# Patient Record
Sex: Female | Born: 1980 | Race: White | Hispanic: No | Marital: Married | Smoking: Never smoker
Health system: Southern US, Community
[De-identification: ages and names within clinical notes are randomized; demographics above are authoritative.]

## PROBLEM LIST (undated history)

## (undated) DIAGNOSIS — R079 Chest pain, unspecified: Secondary | ICD-10-CM

## (undated) DIAGNOSIS — Z8489 Family history of other specified conditions: Secondary | ICD-10-CM

## (undated) DIAGNOSIS — Z8249 Family history of ischemic heart disease and other diseases of the circulatory system: Secondary | ICD-10-CM

## (undated) DIAGNOSIS — M419 Scoliosis, unspecified: Secondary | ICD-10-CM

## (undated) HISTORY — DX: Family history of ischemic heart disease and other diseases of the circulatory system: Z82.49

## (undated) HISTORY — PX: OOPHORECTOMY: SHX86

## (undated) HISTORY — PX: OTHER SURGICAL HISTORY: SHX169

## (undated) HISTORY — PX: KNEE SURGERY: SHX244

## (undated) HISTORY — DX: Chest pain, unspecified: R07.9

## (undated) HISTORY — PX: FOOT SURGERY: SHX648

## (undated) HISTORY — PX: ABDOMINAL HYSTERECTOMY: SHX81

## (undated) HISTORY — PX: APPENDECTOMY: SHX54

## (undated) HISTORY — PX: BACK SURGERY: SHX140

---

## 1998-02-04 ENCOUNTER — Other Ambulatory Visit: Admission: RE | Admit: 1998-02-04 | Discharge: 1998-02-04 | Payer: Self-pay | Admitting: Obstetrics and Gynecology

## 1998-02-22 ENCOUNTER — Ambulatory Visit (HOSPITAL_COMMUNITY): Admission: RE | Admit: 1998-02-22 | Discharge: 1998-02-22 | Payer: Self-pay | Admitting: Obstetrics and Gynecology

## 1998-03-23 ENCOUNTER — Inpatient Hospital Stay: Admission: AD | Admit: 1998-03-23 | Discharge: 1998-03-23 | Payer: Self-pay | Admitting: Obstetrics and Gynecology

## 1998-07-08 ENCOUNTER — Inpatient Hospital Stay (HOSPITAL_COMMUNITY): Admission: AD | Admit: 1998-07-08 | Discharge: 1998-07-08 | Payer: Self-pay | Admitting: Obstetrics and Gynecology

## 1999-02-16 ENCOUNTER — Emergency Department (HOSPITAL_COMMUNITY): Admission: EM | Admit: 1999-02-16 | Discharge: 1999-02-16 | Payer: Self-pay | Admitting: Emergency Medicine

## 2002-06-25 ENCOUNTER — Emergency Department (HOSPITAL_COMMUNITY): Admission: EM | Admit: 2002-06-25 | Discharge: 2002-06-25 | Payer: Self-pay | Admitting: Emergency Medicine

## 2002-12-17 ENCOUNTER — Encounter: Payer: Self-pay | Admitting: Emergency Medicine

## 2002-12-17 ENCOUNTER — Emergency Department (HOSPITAL_COMMUNITY): Admission: EM | Admit: 2002-12-17 | Discharge: 2002-12-17 | Payer: Self-pay | Admitting: Emergency Medicine

## 2004-02-14 ENCOUNTER — Ambulatory Visit: Payer: Self-pay | Admitting: Urology

## 2004-02-29 ENCOUNTER — Other Ambulatory Visit: Admission: RE | Admit: 2004-02-29 | Discharge: 2004-02-29 | Payer: Self-pay | Admitting: Family Medicine

## 2004-02-29 ENCOUNTER — Ambulatory Visit (HOSPITAL_COMMUNITY): Admission: RE | Admit: 2004-02-29 | Discharge: 2004-02-29 | Payer: Self-pay | Admitting: Family Medicine

## 2004-09-20 ENCOUNTER — Emergency Department (HOSPITAL_COMMUNITY): Admission: EM | Admit: 2004-09-20 | Discharge: 2004-09-20 | Payer: Self-pay | Admitting: Internal Medicine

## 2004-12-20 ENCOUNTER — Ambulatory Visit (HOSPITAL_COMMUNITY): Admission: RE | Admit: 2004-12-20 | Discharge: 2004-12-20 | Payer: Self-pay | Admitting: Family Medicine

## 2004-12-20 ENCOUNTER — Emergency Department (HOSPITAL_COMMUNITY): Admission: EM | Admit: 2004-12-20 | Discharge: 2004-12-20 | Payer: Self-pay | Admitting: Family Medicine

## 2005-09-24 ENCOUNTER — Emergency Department (HOSPITAL_COMMUNITY): Admission: EM | Admit: 2005-09-24 | Discharge: 2005-09-25 | Payer: Self-pay | Admitting: Emergency Medicine

## 2006-01-09 ENCOUNTER — Emergency Department (HOSPITAL_COMMUNITY): Admission: EM | Admit: 2006-01-09 | Discharge: 2006-01-09 | Payer: Self-pay | Admitting: Emergency Medicine

## 2007-02-10 ENCOUNTER — Ambulatory Visit: Payer: Self-pay | Admitting: Family Medicine

## 2009-07-18 ENCOUNTER — Emergency Department (HOSPITAL_COMMUNITY): Admission: EM | Admit: 2009-07-18 | Discharge: 2009-07-18 | Payer: Self-pay | Admitting: Emergency Medicine

## 2010-12-03 ENCOUNTER — Ambulatory Visit: Payer: Self-pay | Admitting: Obstetrics & Gynecology

## 2010-12-16 ENCOUNTER — Ambulatory Visit: Payer: Self-pay | Admitting: Obstetrics & Gynecology

## 2010-12-17 LAB — PATHOLOGY REPORT

## 2011-05-13 ENCOUNTER — Emergency Department (HOSPITAL_COMMUNITY)
Admission: EM | Admit: 2011-05-13 | Discharge: 2011-05-13 | Disposition: A | Discharge status: Home / Self Care | Payer: 59 | Attending: Emergency Medicine | Admitting: Emergency Medicine

## 2011-05-13 ENCOUNTER — Encounter (HOSPITAL_COMMUNITY): Payer: Self-pay

## 2011-05-13 DIAGNOSIS — T5894XA Toxic effect of carbon monoxide from unspecified source, undetermined, initial encounter: Secondary | ICD-10-CM | POA: Insufficient documentation

## 2011-05-13 DIAGNOSIS — T588X1A Toxic effect of carbon monoxide from other source, accidental (unintentional), initial encounter: Secondary | ICD-10-CM | POA: Insufficient documentation

## 2011-05-13 DIAGNOSIS — R51 Headache: Secondary | ICD-10-CM

## 2011-05-13 MED ORDER — OXYCODONE-ACETAMINOPHEN 5-325 MG PO TABS
1.0000 | ORAL_TABLET | Freq: Once | ORAL | Status: AC
  Administered 2011-05-13: 1 via ORAL
  Filled 2011-05-13: qty 1

## 2011-05-13 NOTE — ED Notes (Signed)
Pt presents with 1 week h/o headache.  Pt reports home heater went out last night and found out today that there was a carbon monoxide leak.  Son has had symptoms x 2 weeks.  Pt reports dizziness x 2-3 days with today worse and blurred vision.

## 2011-05-13 NOTE — ED Provider Notes (Signed)
History     CSN: [id number]  Arrival date & time 05/13/11  1538   None     Chief Complaint  Patient presents with  . Headache    (Consider location/radiation/quality/duration/timing/severity/associated sxs/prior treatment) Patient is a 31 y.o. female presenting with headaches. The history is provided by the patient.  Headache  Pertinent negatives include no fever and no shortness of breath.   the patient is a 31 year old, female, with no significant past medical history.  She and her son have been having headaches.  For approximately 2 weeks.  Today.  Her husband had the gas company come out.  They detected a carbon monoxide leak.  She has a frontal headache.  She denies nausea, vomiting.  She denies chest pain.  She has not passed.  Out.  She denies any pain anywhere else.  She has not been back in her home since 7:30 this morning.  Past Medical History  Diagnosis Date  . Endometriosis     Past Surgical History  Procedure Date  . Abdominal hysterectomy   . Appendectomy     No family history on file.  History  Substance Use Topics  . Smoking status: Never Smoker   . Smokeless tobacco: Not on file  . Alcohol Use: Yes    OB History    Grav Para Term Preterm Abortions TAB SAB Ect Mult Living                  Review of Systems  Constitutional: Negative for fever.  HENT: Negative for neck pain.   Eyes: Negative for photophobia.  Respiratory: Negative for shortness of breath.   Cardiovascular: Negative for chest pain.  Neurological: Positive for headaches. Negative for seizures and syncope.  Psychiatric/Behavioral: Negative for confusion.  All other systems reviewed and are negative.    Allergies  Review of patient's allergies indicates no known allergies.  Home Medications   Current Outpatient Rx  Name Route Sig Dispense Refill  . PHENTERMINE HCL 37.5 MG PO CAPS Oral Take 37.5 mg by mouth every morning.    Marland Kitchen VITAMIN B-12 100 MCG PO TABS Oral Take 50 mcg by  mouth daily.      BP 121/88  Pulse 98  Temp(Src) 98 F (36.7 C) (Oral)  Resp 16  SpO2 99%  Physical Exam  Constitutional: She is oriented to person, place, and time. She appears well-developed and well-nourished.  HENT:  Head: Normocephalic and atraumatic.  Eyes: Conjunctivae are normal. Pupils are equal, round, and reactive to light.  Neck: Normal range of motion. Neck supple.  Cardiovascular: Normal rate.   No murmur heard. Pulmonary/Chest: Effort normal and breath sounds normal.  Musculoskeletal: Normal range of motion.  Neurological: She is alert and oriented to person, place, and time. No cranial nerve deficit.  Skin: Skin is warm and dry.  Psychiatric: She has a normal mood and affect. Her behavior is normal. Judgment and thought content normal.    ED Course  Procedures (including critical care time) Frontal headache with exposure to carbon monoxide.  No alteration in mental status.  No history of chest pain, shortness breath, or syncope, or seizures.  The patient has not been in her home since 7:30 this morning.  There is no indication for oxygen therapy or measurement of a carbon monoxide level at this time.  Labs Reviewed - No data to display No results found.   No diagnosis found.    MDM  Headache        Tinnie Gens  Brock Bad, MD 05/13/11 1737

## 2012-01-07 ENCOUNTER — Ambulatory Visit: Payer: Self-pay | Admitting: Podiatry

## 2012-07-27 DIAGNOSIS — M4120 Other idiopathic scoliosis, site unspecified: Secondary | ICD-10-CM | POA: Insufficient documentation

## 2013-01-29 ENCOUNTER — Emergency Department (HOSPITAL_COMMUNITY)
Admission: EM | Admit: 2013-01-29 | Discharge: 2013-01-29 | Disposition: A | Discharge status: Home / Self Care | Payer: 59 | Source: Home / Self Care

## 2013-01-29 ENCOUNTER — Encounter (HOSPITAL_COMMUNITY): Payer: Self-pay | Admitting: *Deleted

## 2013-01-29 ENCOUNTER — Emergency Department (INDEPENDENT_AMBULATORY_CARE_PROVIDER_SITE_OTHER): Payer: 59

## 2013-01-29 DIAGNOSIS — M940 Chondrocostal junction syndrome [Tietze]: Secondary | ICD-10-CM

## 2013-01-29 DIAGNOSIS — R141 Gas pain: Secondary | ICD-10-CM

## 2013-01-29 DIAGNOSIS — K5900 Constipation, unspecified: Secondary | ICD-10-CM

## 2013-01-29 HISTORY — DX: Scoliosis, unspecified: M41.9

## 2013-01-29 LAB — POCT URINALYSIS DIP (DEVICE)
Bilirubin Urine: NEGATIVE
Glucose, UA: NEGATIVE mg/dL
Ketones, ur: NEGATIVE mg/dL
Nitrite: NEGATIVE
Protein, ur: NEGATIVE mg/dL
Specific Gravity, Urine: 1.02 (ref 1.005–1.030)
Urobilinogen, UA: 0.2 mg/dL (ref 0.0–1.0)
pH: 6 (ref 5.0–8.0)

## 2013-01-29 NOTE — ED Provider Notes (Signed)
CSN: [id number]     Arrival date & time 01/29/13  1807 History   First MD Initiated Contact with Patient 01/29/13 1851     Chief Complaint  Patient presents with  . Abdominal Pain   (Consider location/radiation/quality/duration/timing/severity/associated sxs/prior Treatment) HPI Comments: Obese 32 year old female presents with epigastric and left upper quadrant pain that started 3 days ago. It started approximately 30 minutes after eating a meal. She and her husband both only herbal life diet. The pain is worse when taking a deep breath. Denies nausea, vomiting, diarrhea, constipation, fever, chills, sore throat, cough or respiratory symptoms.   Past Medical History  Diagnosis Date  . Endometriosis   . Scoliosis    Past Surgical History  Procedure Laterality Date  . Abdominal hysterectomy    . Appendectomy    . Oophorectomy    . Back surgery      Harrington Rod placement  . Foot surgery    . Endometrial surgery    . Cesarean section      x2   No family history on file. History  Substance Use Topics  . Smoking status: Never Smoker   . Smokeless tobacco: Not on file  . Alcohol Use: No   OB History   Grav Para Term Preterm Abortions TAB SAB Ect Mult Living                 Review of Systems  Constitutional: Negative.   HENT: Negative.   Cardiovascular: Negative.   Gastrointestinal: Positive for abdominal pain. Negative for nausea, vomiting, diarrhea, constipation, blood in stool, abdominal distention, anal bleeding and rectal pain.  Genitourinary: Negative.   Musculoskeletal: Negative.   Skin: Negative.   Neurological: Negative.     Allergies  Review of patient's allergies indicates no known allergies.  Home Medications   Current Outpatient Rx  Name  Route  Sig  Dispense  Refill  . UNABLE TO FIND      Med Name: "Cell Activator through [employer]"         . phentermine 37.5 MG capsule   Oral   Take 37.5 mg by mouth every morning.         . vitamin  B-12 (CYANOCOBALAMIN) 100 MCG tablet   Oral   Take 50 mcg by mouth daily.          BP 126/81  Pulse 82  Temp(Src) 98 F (36.7 C) (Oral)  Resp 18  SpO2 100% Physical Exam  Nursing note and vitals reviewed. Constitutional: She is oriented to person, place, and time. She appears well-developed and well-nourished. No distress.  Eyes: Conjunctivae and EOM are normal.  Neck: Normal range of motion. Neck supple.  Cardiovascular: Normal rate, regular rhythm and normal heart sounds.   Pulmonary/Chest: Effort normal and breath sounds normal. No respiratory distress. She has no wheezes.  There is unequivocal reproducible tenderness to the left costal margin from the xiphoid to the midaxillary line of the rib cage. This is in addition to the left upper quadrant abdominal pain.  Abdominal: Soft. Bowel sounds are normal. She exhibits no distension and no mass. There is tenderness. There is no rebound and no guarding.  This abdominal tenderness in the epigastrium left upper and mid quadrant. No lower abdominal or pelvic tenderness. No tenderness or pain to the right hemiabdomen. No palpable masses.  Musculoskeletal: She exhibits no edema and no tenderness.  Neurological: She is alert and oriented to person, place, and time. She exhibits normal muscle tone.  Skin: Skin  is warm. No rash noted.  Psychiatric: She has a normal mood and affect.    ED Course  Procedures (including critical care time) Labs Review Labs Reviewed  POCT URINALYSIS DIP (DEVICE) - Abnormal; Notable for the following:    Hgb urine dipstick TRACE (*)    Leukocytes, UA TRACE (*)    All other components within normal limits   Imaging Review Dg Abd 1 View  01/29/2013   CLINICAL DATA:  Abdominal pain  EXAM: ABDOMEN - 1 VIEW  COMPARISON:  None.  FINDINGS: Scattered large and small bowel gas is noted. Postsurgical changes are noted in thoracolumbar spine. No mass or abnormal calcifications are noted.  IMPRESSION: No acute  abnormality seen.   Electronically Signed   By: Alcide Clever M.D.   On: 01/29/2013 19:38    MDM   1. Abdominal gas pain   2. Costochondritis   3. Constipation      X-ray shows substantial amount of gas in the large and small colon. No air fluid levels or evidence of obstruction. She also has chest wall pain/costochondritis. She is given instructions on constipation, using MiraLax, increasing fluids, prune juice and other measures for constipation. Apply ice to the chest to help with comfort. If needed may take ibuprofen with food for the rib pain. For worsening abdominal pain, bleeding or dark tarry stools, increased abdominal pain inability to have a bowel movement, vomiting either go to the emergent apartment her follow up with your PCP or may return.   Hayden Rasmussen, NP 01/29/13 1958

## 2013-01-29 NOTE — ED Provider Notes (Signed)
Medical screening examination/treatment/procedure(s) were performed by non-physician practitioner and as supervising physician I was immediately available for consultation/collaboration.  Leslee Home, M.D.  Reuben Likes, MD 01/29/13 Rosamaria Lints

## 2013-01-29 NOTE — ED Notes (Signed)
C/O intermittent LUQ abd pain x 2-3 days.  When pain occurs, it happens after eating (though not every time she eats).  Pain worse with deep breathing, sitting, or standing.  Pain now wrapping around left side.  Denies fevers.  Denies n/v.  Has tried Tyl, IBU, and 2 Tramadol without any change.

## 2013-07-14 ENCOUNTER — Emergency Department: Payer: Self-pay | Admitting: Emergency Medicine

## 2013-07-14 LAB — CBC
HCT: 38.1 % (ref 35.0–47.0)
HGB: 12.5 g/dL (ref 12.0–16.0)
MCH: 26.6 pg (ref 26.0–34.0)
MCHC: 32.9 g/dL (ref 32.0–36.0)
MCV: 81 fL (ref 80–100)
Platelet: 294 10*3/uL (ref 150–440)
RBC: 4.71 10*6/uL (ref 3.80–5.20)
RDW: 16.3 % — ABNORMAL HIGH (ref 11.5–14.5)
WBC: 9.5 10*3/uL (ref 3.6–11.0)

## 2013-07-14 LAB — BASIC METABOLIC PANEL
Anion Gap: 3 — ABNORMAL LOW (ref 7–16)
BUN: 10 mg/dL (ref 7–18)
Calcium, Total: 9 mg/dL (ref 8.5–10.1)
Chloride: 105 mmol/L (ref 98–107)
Co2: 29 mmol/L (ref 21–32)
Creatinine: 0.91 mg/dL (ref 0.60–1.30)
EGFR (African American): >60
EGFR (Non-African Amer.): >60
Glucose: 103 mg/dL — ABNORMAL HIGH (ref 65–99)
Osmolality: 273 (ref 275–301)
Potassium: 3.7 mmol/L (ref 3.5–5.1)
Sodium: 137 mmol/L (ref 136–145)

## 2013-07-14 LAB — TROPONIN I: Troponin-I: <0.02 ng/mL

## 2013-08-11 ENCOUNTER — Encounter (HOSPITAL_COMMUNITY): Payer: Self-pay | Admitting: Emergency Medicine

## 2013-08-11 ENCOUNTER — Emergency Department (HOSPITAL_COMMUNITY)
Admission: EM | Admit: 2013-08-11 | Discharge: 2013-08-11 | Disposition: A | Discharge status: Home / Self Care | Payer: 59 | Source: Home / Self Care | Attending: Family Medicine | Admitting: Family Medicine

## 2013-08-11 DIAGNOSIS — H8110 Benign paroxysmal vertigo, unspecified ear: Secondary | ICD-10-CM

## 2013-08-11 MED ORDER — MECLIZINE HCL 25 MG PO TABS: 25.0000 mg | ORAL_TABLET | Freq: Three times a day (TID) | ORAL | Status: DC | PRN

## 2013-08-11 MED ORDER — ONDANSETRON HCL 4 MG PO TABS: 4.0000 mg | ORAL_TABLET | Freq: Three times a day (TID) | ORAL | Status: DC | PRN

## 2013-08-11 MED ORDER — ONDANSETRON 4 MG PO TBDP
4.0000 mg | ORAL_TABLET | Freq: Once | ORAL | Status: AC
  Administered 2013-08-11: 4 mg via ORAL

## 2013-08-11 MED ORDER — ONDANSETRON 4 MG PO TBDP
ORAL_TABLET | ORAL | Status: AC
  Filled 2013-08-11: qty 1

## 2013-08-11 NOTE — ED Provider Notes (Signed)
CSN: [id number]     Arrival date & time 08/11/13  1014 History   None    Chief Complaint  Patient presents with  . Dizziness   (Consider location/radiation/quality/duration/timing/severity/associated sxs/prior Treatment) Patient is a 33 y.o. female presenting with dizziness. The history is provided by the patient.  Dizziness Quality:  Head spinning Severity:  Moderate Onset quality:  Gradual Duration:  4 days Timing:  Intermittent Progression:  Waxing and waning Chronicity:  New Context: bending over, eye movement and head movement   Context: not with bowel movement, not with ear pain, not with loss of consciousness, not with medication, not with physical activity, not when standing up and not when urinating   Relieved by:  Being still Worsened by:  Movement and turning head Associated symptoms: headaches, nausea and vomiting   Associated symptoms: no blood in stool, no chest pain, no diarrhea, no hearing loss, no palpitations, no shortness of breath, no syncope, no tinnitus, no vision changes and no weakness   Risk factors: no anemia, no hx of stroke, no hx of vertigo, no Meniere's disease and no new medications     Past Medical History  Diagnosis Date  . Endometriosis   . Scoliosis    Past Surgical History  Procedure Laterality Date  . Abdominal hysterectomy    . Appendectomy    . Oophorectomy    . Back surgery      Harrington Rod placement  . Foot surgery    . Endometrial surgery    . Cesarean section      x2   History reviewed. No pertinent family history. History  Substance Use Topics  . Smoking status: Never Smoker   . Smokeless tobacco: Not on file  . Alcohol Use: No   OB History   Grav Para Term Preterm Abortions TAB SAB Ect Mult Living                 Review of Systems  Constitutional: Negative.   HENT: Negative for congestion, ear pain, hearing loss, nosebleeds, rhinorrhea, sinus pressure, sore throat and tinnitus.   Eyes: Negative.   Respiratory:  Negative for cough, chest tightness and shortness of breath.   Cardiovascular: Negative for chest pain, palpitations, leg swelling and syncope.  Gastrointestinal: Positive for nausea and vomiting. Negative for abdominal pain, diarrhea, constipation, blood in stool, abdominal distention and anal bleeding.  Endocrine: Negative for polydipsia, polyphagia and polyuria.  Genitourinary: Negative.   Musculoskeletal: Negative.   Skin: Negative.   Neurological: Positive for dizziness and headaches. Negative for seizures, syncope, weakness, light-headedness and numbness.  Psychiatric/Behavioral: Negative for confusion.    Allergies  Review of patient's allergies indicates no known allergies.  Home Medications   Prior to Admission medications   Medication Sig Start Date End Date Taking? Authorizing Provider  phentermine 37.5 MG capsule Take 37.5 mg by mouth every morning.    Historical Provider, MD  UNABLE TO FIND Med Name: "Cell Activator through [employer]"    Historical Provider, MD  vitamin B-12 (CYANOCOBALAMIN) 100 MCG tablet Take 50 mcg by mouth daily.    Historical Provider, MD   BP 142/90  Pulse 78  Temp(Src) 98.3 F (36.8 C) (Oral)  SpO2 100% Physical Exam  Nursing note and vitals reviewed. Constitutional: She is oriented to person, place, and time. She appears well-developed and well-nourished. No distress.  HENT:  Head: Normocephalic and atraumatic.  Right Ear: Hearing, tympanic membrane, external ear and ear canal normal.  Left Ear: Hearing, tympanic membrane, external  ear and ear canal normal.  Nose: Nose normal.  Mouth/Throat: Uvula is midline, oropharynx is clear and moist and mucous membranes are normal.  Eyes: Conjunctivae, EOM and lids are normal. Pupils are equal, round, and reactive to light. Right eye exhibits no nystagmus. Left eye exhibits no nystagmus.  Neck: Normal range of motion. Neck supple. No thyromegaly present.  Cardiovascular: Normal rate, regular rhythm  and normal heart sounds.   Pulmonary/Chest: Effort normal and breath sounds normal.  Abdominal: Soft. Bowel sounds are normal. She exhibits no distension. There is no tenderness.  Musculoskeletal: Normal range of motion.  Lymphadenopathy:    She has no cervical adenopathy.  Neurological: She is alert and oriented to person, place, and time. She has normal strength. No cranial nerve deficit or sensory deficit. She displays a negative Romberg sign. Coordination and gait normal. GCS eye subscore is 4. GCS verbal subscore is 5. GCS motor subscore is 6.  Dizziness and sensation of room spinning reproduced in exam room with turning of head from side to side and also when patient was asked to look downward or flex head forward  Skin: Skin is warm and dry.  Psychiatric: She has a normal mood and affect. Her behavior is normal.    ED Course  Procedures (including critical care time) Labs Review Labs Reviewed - No data to display  Results for orders placed during the hospital encounter of 01/29/13  POCT URINALYSIS DIP (DEVICE)      Result Value Ref Range   Glucose, UA NEGATIVE  NEGATIVE mg/dL   Bilirubin Urine NEGATIVE  NEGATIVE   Ketones, ur NEGATIVE  NEGATIVE mg/dL   Specific Gravity, Urine 1.020  1.005 - 1.030   Hgb urine dipstick TRACE (*) NEGATIVE   pH 6.0  5.0 - 8.0   Protein, ur NEGATIVE  NEGATIVE mg/dL   Urobilinogen, UA 0.2  0.0 - 1.0 mg/dL   Nitrite NEGATIVE  NEGATIVE   Leukocytes, UA TRACE (*) NEGATIVE   Imaging Review No results found.   MDM   1. Benign positional vertigo   Exam without focal neurological deficit. Will provide patient with Rx for meclizine and zofran for symptom relief. She was advised not to drive until symptoms resolve. She was instructed to follow up with PCP if symptoms persist over next 4-5 days despite using medications. If symptoms become suddenly worse or severe and persistent, she is to seek evaluation at her nearest ER.     [place],  [place] 08/11/13 1140

## 2013-08-11 NOTE — ED Provider Notes (Signed)
Medical screening examination/treatment/procedure(s) were performed by resident physician or non-physician practitioner and as supervising physician I was immediately available for consultation/collaboration.   Pauline Good MD.   Billy Fischer, MD 08/11/13 1225

## 2013-08-11 NOTE — Discharge Instructions (Signed)

## 2013-08-11 NOTE — ED Notes (Signed)
Stats she has ben having dizzy spells since Tuesday, w nausea and vomiting. Just off antibiotics for URI. , seen in ED for chest pain and was told her EKG didn't read right, but her blood levels were okay. Reportedly has an appointment to see cardiologist next week. NAD

## 2013-08-14 ENCOUNTER — Encounter: Payer: Self-pay | Admitting: Cardiovascular Disease

## 2013-08-14 ENCOUNTER — Ambulatory Visit (INDEPENDENT_AMBULATORY_CARE_PROVIDER_SITE_OTHER): Payer: 59 | Admitting: Cardiovascular Disease

## 2013-08-14 VITALS — BP 112/92 | HR 98 | Ht 65.0 in | Wt 194.1 lb

## 2013-08-14 DIAGNOSIS — Z8349 Family history of other endocrine, nutritional and metabolic diseases: Secondary | ICD-10-CM

## 2013-08-14 DIAGNOSIS — Z83438 Family history of other disorder of lipoprotein metabolism and other lipidemia: Secondary | ICD-10-CM

## 2013-08-14 DIAGNOSIS — R079 Chest pain, unspecified: Secondary | ICD-10-CM

## 2013-08-14 NOTE — Progress Notes (Signed)
     08/14/2013 Crystal Pearson   [birth year]  [id number]  Primary Physician Crystal Naas, MD Primary Cardiologist: Crystal Harp MD Crystal Pearson   HPI:  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 She self referred for cardiovascular evaluation because of a recent episode of prolonged chest pain.she apparently developed chest pain at a.m. Several weeks ago. This lasted up until 10 AM. She was seen at [facility] emergency room. Troponins apparently negative and she was released home. She has had no recurrent chest pain. Her cardiovascular risk factor profile is only remarkable for family history with a father who had bypass surgery in his 55s but is otherwise negative. She has had a partial hysterectomy in the past.   No current outpatient prescriptions on file.   No current facility-administered medications for this visit.    No Known Allergies  History   Social History  . Marital Status: Married    Spouse Name: N/A    Number of Children: N/A  . Years of Education: N/A   Occupational History  . Not on file.   Social History Main Topics  . Smoking status: Never Smoker   . Smokeless tobacco: Not on file  . Alcohol Use: No  . Drug Use: No  . Sexual Activity: Not on file   Other Topics Concern  . Not on file   Social History Narrative  . No narrative on file     Review of Systems: General: negative for chills, fever, night sweats or weight changes.  Cardiovascular: negative for chest pain, dyspnea on exertion, edema, orthopnea, palpitations, paroxysmal nocturnal dyspnea or shortness of breath Dermatological: negative for rash Respiratory: negative for cough or wheezing Urologic: negative for hematuria Abdominal: negative for nausea, vomiting, diarrhea, bright red blood per rectum, melena, or hematemesis Neurologic: negative for visual  changes, syncope, or dizziness All other systems reviewed and are otherwise negative except as noted above.    Blood pressure 112/92, pulse 98, height 5\' 5"  (1.651 m), weight 194 lb 1.6 oz (88.043 kg).  General appearance: alert and no distress Neck: no adenopathy, no carotid bruit, no JVD, supple, symmetrical, trachea midline and thyroid not enlarged, symmetric, no tenderness/mass/nodules Lungs: clear to auscultation bilaterally Heart: regular rate and rhythm, S1, S2 normal, no murmur, click, rub or gallop Extremities: extremities normal, atraumatic, no cyanosis or edema  EKG normal sinus rhythm at 98 with nonspecific ST and T wave changes  ASSESSMENT AND PLAN:   Chest pain The patient developed substernal chest pain at 3 AM several weeks ago. The pain lasted until 10 AM. She was seen at [facility] emergency room where she was evaluated. Troponins were negative and she was sent home. She did see a cardiologist back in followup who apparently reassured her. She's had no recurrent symptoms. Risk factors include family history of heart disease with a father who had coronary bypass grafting in his 84s. I am going to get a NMR Lipid Profile risk stratify her for the echo to rule out regional wall motion abnormalities given her prolonged episode of chest pain.      Crystal Harp MD FACP,FACC,FAHA, [facility] 08/14/2013 3:38 PM

## 2013-08-14 NOTE — Patient Instructions (Signed)
  We will see you back in follow up in 1 year with Dr Gwenlyn Found  Dr Gwenlyn Found has ordered an echocardiogram and blood work to be done.

## 2013-08-14 NOTE — Assessment & Plan Note (Addendum)
The patient developed substernal chest pain at 3 AM several weeks ago. The pain lasted until 10 AM. She was seen at [facility] emergency room where she was evaluated. Troponins were negative and she was sent home. She did see a cardiologist back in followup who apparently reassured her. She's had no recurrent symptoms. Risk factors include family history of heart disease with a father who had coronary bypass grafting in his 54s. I am going to get a NMR Lipid Profile risk stratify her for the echo to rule out regional wall motion abnormalities given her prolonged episode of chest pain.

## 2013-08-21 ENCOUNTER — Ambulatory Visit (HOSPITAL_COMMUNITY): Payer: 59

## 2013-09-01 ENCOUNTER — Telehealth (HOSPITAL_COMMUNITY): Payer: Self-pay | Admitting: *Deleted

## 2013-09-12 ENCOUNTER — Telehealth (HOSPITAL_COMMUNITY): Payer: Self-pay | Admitting: *Deleted

## 2014-03-27 DIAGNOSIS — M47816 Spondylosis without myelopathy or radiculopathy, lumbar region: Secondary | ICD-10-CM | POA: Insufficient documentation

## 2016-08-28 ENCOUNTER — Encounter (HOSPITAL_COMMUNITY): Payer: Self-pay

## 2016-08-28 ENCOUNTER — Ambulatory Visit (HOSPITAL_COMMUNITY)
Admission: EM | Admit: 2016-08-28 | Discharge: 2016-08-28 | Disposition: A | Discharge status: Nursing Home (Includes ICF) | Payer: Self-pay

## 2016-08-28 ENCOUNTER — Emergency Department (HOSPITAL_COMMUNITY)
Admission: EM | Admit: 2016-08-28 | Discharge: 2016-08-29 | Disposition: A | Discharge status: Home / Self Care | Payer: BC Managed Care – PPO | Attending: Emergency Medicine | Admitting: Emergency Medicine

## 2016-08-28 DIAGNOSIS — G9009 Other idiopathic peripheral autonomic neuropathy: Secondary | ICD-10-CM | POA: Insufficient documentation

## 2016-08-28 DIAGNOSIS — G608 Other hereditary and idiopathic neuropathies: Secondary | ICD-10-CM

## 2016-08-28 DIAGNOSIS — G629 Polyneuropathy, unspecified: Secondary | ICD-10-CM

## 2016-08-28 DIAGNOSIS — M79661 Pain in right lower leg: Secondary | ICD-10-CM | POA: Diagnosis present

## 2016-08-28 NOTE — ED Notes (Addendum)
Pt presents with numbness and tingling to R shin with veins more visible than normal.  Pt sts she went to urgent care, who said they did not have the means to treat her if it was a blood clot.  Urgent care sent her here.  Family hx of blood clots.

## 2016-08-28 NOTE — ED Provider Notes (Signed)
[facility] DEPT Provider Note   CSN: [id number] Arrival date & time: 08/28/16  1922   By signing my name below, I, Crystal Pearson, attest that this documentation has been prepared under the direction and in the presence of  [name], PA-C . Electronically Signed: Delton Pearson, ED Scribe. 08/28/16. 11:38 PM.   History   Chief Complaint Chief Complaint  Patient presents with  . Leg Pain    HPI Comments:  Crystal Pearson is a 36 y.o. female who presents to the Emergency Department complaining of acute onset, persistent right shin numbness, pain and minimal swelling beginning 4 days ago. Pt states she has been experiencing right sided lower back pain intermittently but notes she has not felt this pain is several days. Pt reports her mother had a DVT during a pregnacy. No h/o of hypercoagulable conditions. No alleviating factors noted. Pt denies SOB or any other associated symptoms. Pt also denies recent long distance travel, any recent surgeries, any recent injuries/falls, medication allergies or a hx of similar symptoms. Pt notes she takes Prozac on a daily basis for anxiety/depression. No estrogen-containing birth control. No other complaints noted at this time.   The history is provided by the patient. No language interpreter was used.    Past Medical History:  Diagnosis Date  . Chest pain   . Endometriosis   . Family history of heart disease   . Scoliosis     Patient Active Problem List   Diagnosis Date Noted  . Chest pain 08/14/2013    Past Surgical History:  Procedure Laterality Date  . ABDOMINAL HYSTERECTOMY    . APPENDECTOMY    . BACK SURGERY     Harrington Rod placement  . CESAREAN SECTION     x2  . endometrial surgery    . FOOT SURGERY    . OOPHORECTOMY      OB History    No data available       Home Medications    Prior to Admission medications   Not on File    Family History Family History  Problem Relation Age of Onset  . Hypertension Mother    . Cancer Father   . Heart disease Father     Social History Social History  Substance Use Topics  . Smoking status: Never Smoker  . Smokeless tobacco: Never Used  . Alcohol use No     Allergies   Patient has no known allergies.   Review of Systems Review of Systems  Constitutional: Negative for activity change.  Respiratory: Negative for shortness of breath.   Cardiovascular: Negative for chest pain.  Gastrointestinal: Negative for abdominal pain.  Musculoskeletal: Positive for myalgias. Negative for arthralgias, back pain and gait problem.  Skin: Negative for rash.  Allergic/Immunologic: Negative for immunocompromised state.  Neurological: Positive for numbness. Negative for dizziness, tremors, syncope, weakness, light-headedness and headaches.  Hematological: Does not bruise/bleed easily.  Psychiatric/Behavioral: Negative for confusion.    Physical Exam Updated Vital Signs BP 136/83 (BP Location: Left Arm)   Pulse (!) 55   Temp 98.2 F (36.8 C)   Resp 18   SpO2 100%   Physical Exam  Constitutional: She is oriented to person, place, and time. She appears well-developed and well-nourished. No distress.  HENT:  Head: Normocephalic and atraumatic.  Eyes: Conjunctivae are normal.  Neck: Neck supple.  Cardiovascular: Normal rate and regular rhythm.  Exam reveals no gallop and no friction rub.   No murmur heard. Pulmonary/Chest: Effort normal and breath  sounds normal. No respiratory distress. She has no wheezes. She has no rales.  Abdominal: Soft. She exhibits no distension. There is no tenderness. There is no guarding.  Musculoskeletal: She exhibits no edema.  Neurological: She is alert and oriented to person, place, and time. She has normal strength. She displays no atrophy. No cranial nerve deficit. She displays a negative Romberg sign. Coordination and gait normal. GCS eye subscore is 4. GCS verbal subscore is 5. GCS motor subscore is 6.  Reflex Scores:       Patellar reflexes are 4+ on the right side and 2+ on the left side. DTR on right is 4+. Decreased sensation with sharp and light touch. Finger-to-nose, Heel-to-shin, and rapid alternating hand movements intact.   Skin: Skin is warm and dry. No rash noted. She is not diaphoretic.  Psychiatric: Her behavior is normal.  Nursing note and vitals reviewed.    ED Treatments / Results  DIAGNOSTIC STUDIES:  Oxygen Saturation is 99% on RA, normal by my interpretation.    COORDINATION OF CARE:  11:37 PM Discussed treatment plan with pt at bedside and pt agreed to plan.  Labs (all labs ordered are listed, but only abnormal results are displayed) Labs Reviewed - No data to display  EKG  EKG Interpretation None       Radiology No results found.  Procedures Procedures (including critical care time)  Medications Ordered in ED Medications - No data to display   Initial Impression / Assessment and Plan / ED Course  I have reviewed the triage vital signs and the nursing notes.  Pertinent labs & imaging results that were available during my care of the patient were reviewed by me and considered in my medical decision making (see chart for details).     36 y.o. female who presents to the Emergency Department with numbness to the right shin that began 5 days ago. Patient was concerned she had a DVT so she presented for [place] evaluation. No DVT risk factors. VSS. NAD. PE with right patellar hyperreflexia and decreased sensation to the anterior shin and plantar surface of the right foot. No concern for cauda equina or spinal cord compression at this time.After reviewing the history and physical, I feel that no further work up is warranted at this time. Discussed the patient with Dr. Eulis Foster. Will discharge the patient to home with trial of NSAIDs and follow up to PCP.   Final Clinical Impressions(s) / ED Diagnoses   Final diagnoses:  Peripheral sensory neuropathy   New Prescriptions There are  no discharge medications for this patient.  I personally performed the services described in this documentation, which was scribed in my presence. The recorded information has been reviewed and is accurate.     [name], PA-C 08/31/16 2300    [name], MD 09/02/16 [phone]

## 2016-08-28 NOTE — ED Triage Notes (Signed)
Pt complaining of R shin numbness. Pt denies any injury/trauma. Pt with full sensation to foot, pt with full ROM.

## 2016-08-29 NOTE — Discharge Instructions (Signed)
Please keep your appointment with your primary care provider on 5/9. You have been diagnosed with peripheral neuropathy. You can treat the symptoms with ibuprofen 800 mg every 8 hours. If you develop symptoms on the opposite leg, if the symptoms worsen, if you lose control of your bladder or bowels, please return for re-evaluation.

## 2016-11-23 ENCOUNTER — Encounter: Payer: Self-pay | Admitting: Obstetrics and Gynecology

## 2016-11-23 ENCOUNTER — Ambulatory Visit (INDEPENDENT_AMBULATORY_CARE_PROVIDER_SITE_OTHER): Payer: BC Managed Care – PPO | Admitting: Obstetrics and Gynecology

## 2016-11-23 ENCOUNTER — Telehealth: Payer: Self-pay

## 2016-11-23 VITALS — BP 122/90 | HR 71 | Ht 65.0 in | Wt 196.0 lb

## 2016-11-23 DIAGNOSIS — N3001 Acute cystitis with hematuria: Secondary | ICD-10-CM

## 2016-11-23 DIAGNOSIS — R300 Dysuria: Secondary | ICD-10-CM

## 2016-11-23 LAB — POCT URINALYSIS DIPSTICK
Bilirubin, UA: NEGATIVE
Blood, UA: POSITIVE
Glucose, UA: NEGATIVE
Ketones, UA: NEGATIVE
Nitrite, UA: NEGATIVE
Protein, UA: NEGATIVE
Spec Grav, UA: 1.02 (ref 1.010–1.025)
Urobilinogen, UA: NEGATIVE E.U./dL — AB
pH, UA: 6 (ref 5.0–8.0)

## 2016-11-23 MED ORDER — NITROFURANTOIN MONOHYD MACRO 100 MG PO CAPS: 100.0000 mg | ORAL_CAPSULE | Freq: Two times a day (BID) | ORAL | 0 refills | Status: DC

## 2016-11-23 NOTE — Progress Notes (Signed)
Chief Complaint  Patient presents with  . Urinary Tract Infection    HPI:      [name] is a 36 y.o. No obstetric history on file. who LMP was No LMP recorded. Patient has had a hysterectomy., presents today for UTI sx of dysuria, frequency, urgency, pressure since this AM. She denies any hematuria or vag bleeding. No fevers, although feels cold, no vag sx, and no LBP. She has a hx of UTIs in the past.    Past Medical History:  Diagnosis Date  . Chest pain   . Endometriosis   . Family history of heart disease   . Scoliosis     Past Surgical History:  Procedure Laterality Date  . ABDOMINAL HYSTERECTOMY    . APPENDECTOMY    . BACK SURGERY     Harrington Rod placement  . CESAREAN SECTION     x2  . endometrial surgery    . FOOT SURGERY    . OOPHORECTOMY      Family History  Problem Relation Age of Onset  . Hypertension Mother   . Cancer Father   . Heart disease Father     Social History   Social History  . Marital status: Married    Spouse name: N/A  . Number of children: N/A  . Years of education: N/A   Occupational History  . Not on file.   Social History Main Topics  . Smoking status: Never Smoker  . Smokeless tobacco: Never Used  . Alcohol use No  . Drug use: No  . Sexual activity: Not on file   Other Topics Concern  . Not on file   Social History Narrative  . No narrative on file     Current Outpatient Prescriptions:  .  nitrofurantoin, macrocrystal-monohydrate, (MACROBID) 100 MG capsule, Take 1 capsule (100 mg total) by mouth 2 (two) times daily., Disp: 10 capsule, Rfl: 0   ROS:  Review of Systems  Constitutional: Negative for fever.  Gastrointestinal: Negative for blood in stool, constipation, diarrhea, nausea and vomiting.  Genitourinary: Positive for dysuria, frequency and urgency. Negative for dyspareunia, flank pain, hematuria, vaginal bleeding, vaginal discharge and vaginal pain.  Musculoskeletal: Negative for back pain.    Skin: Negative for rash.     OBJECTIVE:   Vitals:  BP 122/90   Pulse 71   Ht 5\' 5"  (1.651 m)   Wt 196 lb (88.9 kg)   BMI 32.62 kg/m   Physical Exam  Constitutional: She is oriented to person, place, and time and well-developed, well-nourished, and in no distress.  Abdominal: There is no CVA tenderness.  Neurological: She is alert and oriented to person, place, and time.  Psychiatric: Affect and judgment normal.  Vitals reviewed.   Results: Results for orders placed or performed in visit on 11/23/16 (from the past 24 hour(s))  POCT urinalysis dipstick     Status: Abnormal   Collection Time: 11/23/16 11:26 AM  Result Value Ref Range   Color, UA yellow    Clarity, UA clear    Glucose, UA neg    Bilirubin, UA neg    Ketones, UA neg    Spec Grav, UA 1.020 1.010 - 1.025   Blood, UA positive    pH, UA 6.0 5.0 - 8.0   Protein, UA neg    Urobilinogen, UA negative (A) 0.2 or 1.0 E.U./dL   Nitrite, UA neg    Leukocytes, UA Small (1+) (A) Negative     Assessment/Plan: Acute  cystitis with hematuria - Rx macrobid. Check C&S. F/u prn.  - Plan: nitrofurantoin, macrocrystal-monohydrate, (MACROBID) 100 MG capsule, Urine Culture  Dysuria - Plan: POCT urinalysis dipstick, nitrofurantoin, macrocrystal-monohydrate, (MACROBID) 100 MG capsule, Urine Culture    Meds ordered this encounter  Medications  . nitrofurantoin, macrocrystal-monohydrate, (MACROBID) 100 MG capsule    Sig: Take 1 capsule (100 mg total) by mouth 2 (two) times daily.    Dispense:  10 capsule    Refill:  0      Return if symptoms worsen or fail to improve.  Geniene List B. Euel Castile, PA-C 11/23/2016 11:36 AM

## 2016-11-23 NOTE — Telephone Encounter (Incomplete)
Patient feels she needs to be seen today.  Symptoms are pressure sensation and running back and forth to urinate.  No opening

## 2016-11-25 LAB — URINE CULTURE

## 2016-11-26 ENCOUNTER — Telehealth: Payer: Self-pay

## 2016-11-26 NOTE — Telephone Encounter (Signed)
Pt was seen Mon for UTI, given antibx, still has pressure and back is hurting again.  Can she have a stronger antibx?  [phone]

## 2016-11-27 NOTE — Telephone Encounter (Signed)
Please advise patient calling back due to pain level. Asking is there is another medication she can take.

## 2016-11-28 ENCOUNTER — Other Ambulatory Visit: Payer: Self-pay | Admitting: Obstetrics and Gynecology

## 2016-11-28 MED ORDER — CIPROFLOXACIN HCL 500 MG PO TABS: 500.0000 mg | ORAL_TABLET | Freq: Two times a day (BID) | ORAL | 0 refills | Status: AC

## 2016-11-28 NOTE — Telephone Encounter (Signed)
LM for pt. Rx cipro eRxd. F/u if sx persist.

## 2017-06-08 ENCOUNTER — Other Ambulatory Visit: Payer: Self-pay | Admitting: Family Medicine

## 2017-06-08 ENCOUNTER — Ambulatory Visit
Admission: RE | Admit: 2017-06-08 | Discharge: 2017-06-08 | Disposition: A | Discharge status: Home / Self Care | Payer: BC Managed Care – PPO | Source: Ambulatory Visit | Attending: Family Medicine | Admitting: Family Medicine

## 2017-06-08 DIAGNOSIS — R059 Cough, unspecified: Secondary | ICD-10-CM

## 2017-06-08 DIAGNOSIS — R05 Cough: Secondary | ICD-10-CM

## 2017-07-19 ENCOUNTER — Ambulatory Visit: Payer: BC Managed Care – PPO | Admitting: Obstetrics & Gynecology

## 2017-09-10 ENCOUNTER — Ambulatory Visit (INDEPENDENT_AMBULATORY_CARE_PROVIDER_SITE_OTHER): Payer: BC Managed Care – PPO | Admitting: Obstetrics & Gynecology

## 2017-09-10 ENCOUNTER — Encounter: Payer: Self-pay | Admitting: Obstetrics & Gynecology

## 2017-09-10 VITALS — BP 130/80 | Ht 65.0 in | Wt 207.0 lb

## 2017-09-10 DIAGNOSIS — Z0000 Encounter for general adult medical examination without abnormal findings: Secondary | ICD-10-CM | POA: Diagnosis not present

## 2017-09-10 DIAGNOSIS — Z131 Encounter for screening for diabetes mellitus: Secondary | ICD-10-CM

## 2017-09-10 DIAGNOSIS — Z13220 Encounter for screening for lipoid disorders: Secondary | ICD-10-CM | POA: Diagnosis not present

## 2017-09-10 DIAGNOSIS — Z1321 Encounter for screening for nutritional disorder: Secondary | ICD-10-CM

## 2017-09-10 DIAGNOSIS — Z1329 Encounter for screening for other suspected endocrine disorder: Secondary | ICD-10-CM

## 2017-09-10 DIAGNOSIS — Z1272 Encounter for screening for malignant neoplasm of vagina: Secondary | ICD-10-CM | POA: Diagnosis not present

## 2017-09-10 MED ORDER — PHENTERMINE HCL 37.5 MG PO TABS: 37.5000 mg | ORAL_TABLET | Freq: Every day | ORAL | 1 refills | Status: DC

## 2017-09-10 NOTE — Progress Notes (Signed)
HPI:      [name] is a 37 y.o. No obstetric history on file. who LMP was No LMP recorded. Patient has had a hysterectomy., she presents today for her annual examination. The patient has no complaints today. The patient is sexually active. Her last pap: approximate date 2016 and was normal. The patient does perform self breast exams.  There is no notable family history of breast or ovarian cancer in her family.  The patient has regular exercise: yes.  The patient denies current symptoms of depression.  Weight gain a concern, 35 lbs; due to stress, father died in 05/17/2023.  GYN History: Contraception: status post hysterectomy  PMHx: Past Medical History:  Diagnosis Date  . Chest pain   . Endometriosis   . Family history of heart disease   . Scoliosis    Past Surgical History:  Procedure Laterality Date  . ABDOMINAL HYSTERECTOMY    . APPENDECTOMY    . BACK SURGERY     Harrington Rod placement  . CESAREAN SECTION     x2  . endometrial surgery    . FOOT SURGERY    . OOPHORECTOMY     Family History  Problem Relation Age of Onset  . Hypertension Mother   . Cancer Father   . Heart disease Father    Social History   Tobacco Use  . Smoking status: Never Smoker  . Smokeless tobacco: Never Used  Substance Use Topics  . Alcohol use: No  . Drug use: No   No current outpatient medications on file. Allergies: Patient has no known allergies.  Review of Systems  Constitutional: Positive for malaise/fatigue. Negative for chills and fever.  HENT: Negative for congestion, sinus pain and sore throat.   Eyes: Negative for blurred vision and pain.  Respiratory: Negative for cough and wheezing.   Cardiovascular: Negative for chest pain and leg swelling.  Gastrointestinal: Negative for abdominal pain, constipation, diarrhea, heartburn, nausea and vomiting.  Genitourinary: Negative for dysuria, frequency, hematuria and urgency.  Musculoskeletal: Negative for back pain, joint pain,  myalgias and neck pain.  Skin: Negative for itching and rash.  Neurological: Negative for dizziness, tremors and weakness.  Endo/Heme/Allergies: Does not bruise/bleed easily.  Psychiatric/Behavioral: Positive for depression. The patient is nervous/anxious and has insomnia.     Objective: BP 130/80   Ht 5\' 5"  (1.651 m)   Wt 207 lb (93.9 kg)   BMI 34.45 kg/m   Filed Weights   09/10/17 1529  Weight: 207 lb (93.9 kg)   Body mass index is 34.45 kg/m. Physical Exam  Constitutional: She is oriented to person, place, and time. She appears well-developed and well-nourished. No distress.  Genitourinary: Rectum normal and vagina normal. Pelvic exam was performed with patient supine. There is no rash or lesion on the right labia. There is no rash or lesion on the left labia. Vagina exhibits no lesion. No bleeding in the vagina. Right adnexum does not display mass and does not display tenderness. Left adnexum does not display mass and does not display tenderness. Cervix does not exhibit motion tenderness, lesion, friability or polyp.  HENT:  Head: Normocephalic and atraumatic. Head is without laceration.  Right Ear: Hearing normal.  Left Ear: Hearing normal.  Nose: No epistaxis.  No foreign bodies.  Mouth/Throat: Uvula is midline, oropharynx is clear and moist and mucous membranes are normal.  Eyes: Pupils are equal, round, and reactive to light.  Neck: Normal range of motion. Neck supple. No thyromegaly present.  Cardiovascular: Normal rate and regular rhythm. Exam reveals no gallop and no friction rub.  No murmur heard. Pulmonary/Chest: Effort normal and breath sounds normal. No respiratory distress. She has no wheezes. Right breast exhibits no mass, no skin change and no tenderness. Left breast exhibits no mass, no skin change and no tenderness.  Abdominal: Soft. Bowel sounds are normal. She exhibits no distension. There is no tenderness. There is no rebound.  Musculoskeletal: Normal range of  motion.  Neurological: She is alert and oriented to person, place, and time. No cranial nerve deficit.  Skin: Skin is warm and dry.  Psychiatric: She has a normal mood and affect. Judgment normal.  Vitals reviewed.   Assessment:  ANNUAL EXAM 1. Annual physical exam   2. Screening for vaginal cancer   3. Screening for thyroid disorder   4. Screening for diabetes mellitus   5. Screening cholesterol level   6. Encounter for vitamin deficiency screening      Screening Plan:            1.  Cervical Screening-  Pap smear done today  2. Breast screening- Exam annually and mammogram>40 planned   3. Colonoscopy every 10 years, Hemoccult testing - after age 8  4. Labs To return fasting at a later date  5. Counseling for contraception: s/p hyst (still has cervix)   6. Obesity.  Diet, exercise, meds discussed.  Phentermine in past has helped.  Monitor for side effects.  F/u 2 mos    F/U  Return in about 2 months (around 11/10/2017) for Follow up.  Barnett Applebaum, MD, [facility], [facility] 09/10/2017  4:07 PM

## 2017-09-10 NOTE — Patient Instructions (Signed)

## 2017-09-17 ENCOUNTER — Other Ambulatory Visit: Payer: BC Managed Care – PPO

## 2017-09-17 DIAGNOSIS — Z131 Encounter for screening for diabetes mellitus: Secondary | ICD-10-CM

## 2017-09-17 DIAGNOSIS — Z13220 Encounter for screening for lipoid disorders: Secondary | ICD-10-CM

## 2017-09-17 DIAGNOSIS — Z1329 Encounter for screening for other suspected endocrine disorder: Secondary | ICD-10-CM

## 2017-09-17 DIAGNOSIS — Z1321 Encounter for screening for nutritional disorder: Secondary | ICD-10-CM

## 2017-09-18 LAB — VITAMIN B12: Vitamin B-12: 593 pg/mL (ref 232–1245)

## 2017-09-18 LAB — LIPID PANEL
CHOL/HDL RATIO: 4.9 ratio — AB (ref 0.0–4.4)
Cholesterol, Total: 202 mg/dL — ABNORMAL HIGH (ref 100–199)
HDL: 41 mg/dL (ref >39)
LDL Calculated: 131 mg/dL — ABNORMAL HIGH (ref 0–99)
Triglycerides: 148 mg/dL (ref 0–149)
VLDL Cholesterol Cal: 30 mg/dL (ref 5–40)

## 2017-09-18 LAB — HEMOGLOBIN A1C
ESTIMATED AVERAGE GLUCOSE: 114 mg/dL
HEMOGLOBIN A1C: 5.6 % (ref 4.8–5.6)

## 2017-09-18 LAB — TSH: TSH: 1.71 u[IU]/mL (ref 0.450–4.500)

## 2017-09-18 LAB — VITAMIN D 25 HYDROXY (VIT D DEFICIENCY, FRACTURES): Vit D, 25-Hydroxy: 28.6 ng/mL — ABNORMAL LOW (ref 30.0–100.0)

## 2017-09-21 ENCOUNTER — Encounter: Payer: Self-pay | Admitting: Obstetrics & Gynecology

## 2017-09-21 NOTE — Progress Notes (Signed)
Letter sent. Slightly low vit D: rec daily supplentation Slightly high cholesterol: rec diet, exercise, weight loss, supplements (omega 3)

## 2017-09-29 LAB — PAP IG (IMAGE GUIDED): PAP Smear Comment: 0

## 2017-11-05 ENCOUNTER — Ambulatory Visit: Payer: BC Managed Care – PPO | Admitting: Obstetrics & Gynecology

## 2017-12-08 ENCOUNTER — Ambulatory Visit: Payer: BC Managed Care – PPO | Admitting: Obstetrics & Gynecology

## 2018-01-03 ENCOUNTER — Other Ambulatory Visit: Payer: Self-pay | Admitting: Obstetrics & Gynecology

## 2018-01-11 ENCOUNTER — Encounter: Payer: Self-pay | Admitting: Obstetrics and Gynecology

## 2018-01-11 ENCOUNTER — Ambulatory Visit (INDEPENDENT_AMBULATORY_CARE_PROVIDER_SITE_OTHER): Payer: BC Managed Care – PPO | Admitting: Obstetrics and Gynecology

## 2018-01-11 VITALS — BP 128/88 | HR 78 | Ht 65.0 in | Wt 210.0 lb

## 2018-01-11 DIAGNOSIS — R300 Dysuria: Secondary | ICD-10-CM

## 2018-01-11 DIAGNOSIS — B379 Candidiasis, unspecified: Secondary | ICD-10-CM | POA: Diagnosis not present

## 2018-01-11 DIAGNOSIS — N3000 Acute cystitis without hematuria: Secondary | ICD-10-CM

## 2018-01-11 DIAGNOSIS — R350 Frequency of micturition: Secondary | ICD-10-CM

## 2018-01-11 LAB — POCT URINALYSIS DIPSTICK
BILIRUBIN UA: NEGATIVE
GLUCOSE UA: NEGATIVE
Ketones, UA: NEGATIVE
Nitrite, UA: NEGATIVE
Protein, UA: NEGATIVE
RBC UA: NEGATIVE
Urobilinogen, UA: 0.2 E.U./dL
pH, UA: 6.5 (ref 5.0–8.0)

## 2018-01-11 MED ORDER — CIPROFLOXACIN HCL 250 MG PO TABS: 250.0000 mg | ORAL_TABLET | Freq: Two times a day (BID) | ORAL | 0 refills | Status: DC

## 2018-01-11 MED ORDER — FLUCONAZOLE 150 MG PO TABS: 150.0000 mg | ORAL_TABLET | ORAL | 0 refills | Status: AC

## 2018-01-11 NOTE — Progress Notes (Signed)
Patient ID: Crystal Pearson, female   DOB: August 08, 1980, 37 y.o.   MRN: [id number]  Reason for Consult: Urinary Tract Infection (Burning with urination, pressure in bladder, frequency)   Referred by Carol Ada, MD  Subjective:     HPI:  Crystal Pearson is a 37 y.o. female . She is here today because of complains of painful urination, frequent urination and difficulty holding urine. She reports that this began Saturday. She has not had improvement despite using OTC azo. She sat on the toilet for a long time today with the urge to urinate, but could not. She is feeling a lot of pressure.   Past Medical History:  Diagnosis Date  . Chest pain   . Endometriosis   . Family history of heart disease   . Scoliosis    Family History  Problem Relation Age of Onset  . Hypertension Mother   . Cancer Father   . Heart disease Father    Past Surgical History:  Procedure Laterality Date  . ABDOMINAL HYSTERECTOMY    . APPENDECTOMY    . BACK SURGERY     Harrington Rod placement  . CESAREAN SECTION     x2  . endometrial surgery    . FOOT SURGERY    . OOPHORECTOMY      Short Social History:  Social History   Tobacco Use  . Smoking status: Never Smoker  . Smokeless tobacco: Never Used  Substance Use Topics  . Alcohol use: No    No Known Allergies  Current Outpatient Medications  Medication Sig Dispense Refill  . Multiple Vitamin (MULTIVITAMIN) tablet Take 1 tablet by mouth daily.     No current facility-administered medications for this visit.     Review of Systems  Constitutional: Negative for chills, fatigue, fever and unexpected weight change.  HENT: Negative for trouble swallowing.  Eyes: Negative for loss of vision.  Respiratory: Negative for cough, shortness of breath and wheezing.  Cardiovascular: Negative for chest pain, leg swelling, palpitations and syncope.  GI: Negative for abdominal pain, blood in stool, diarrhea, nausea and vomiting.  GU: Negative for  difficulty urinating, dysuria, frequency and hematuria.  Musculoskeletal: Negative for back pain, leg pain and joint pain.  Skin: Negative for rash.  Neurological: Negative for dizziness, headaches, light-headedness, numbness and seizures.  Psychiatric: Negative for behavioral problem, confusion, depressed mood and sleep disturbance.        Objective:  Objective   Vitals:   01/11/18 1425  BP: 128/88  Pulse: 78  Weight: 210 lb (95.3 kg)  Height: 5\' 5"  (1.651 m)   Body mass index is 34.95 kg/m.  Physical Exam  Constitutional: She is oriented to person, place, and time. She appears well-developed and well-nourished.  HENT:  Head: Normocephalic and atraumatic.  Eyes: Pupils are equal, round, and reactive to light. EOM are normal.  Cardiovascular: Normal rate and regular rhythm.  Pulmonary/Chest: Effort normal. No respiratory distress.  Neurological: She is alert and oriented to person, place, and time.  Skin: Skin is warm and dry.  Psychiatric: She has a normal mood and affect. Her behavior is normal. Judgment and thought content normal.  Nursing note and vitals reviewed.      Assessment/Plan:     Will treat UTI with ciprofloxacin. Urine culture sent.  Requested diflucan because she often gest a yeast infection after antibiotics.  More than 10 minutes were spent face to face with the patient in the room with more than 50% of the time  spent providing counseling and discussing the plan of management.    Adrian Prows MD [facility], [facility] 01/11/18 3:09 PM

## 2018-01-13 ENCOUNTER — Telehealth: Payer: Self-pay

## 2018-01-13 LAB — URINE CULTURE

## 2018-01-13 NOTE — Telephone Encounter (Signed)
Pt saw [facility] for UTI; antibx is not working.  [phone]

## 2018-03-11 ENCOUNTER — Ambulatory Visit: Payer: BC Managed Care – PPO | Admitting: Obstetrics & Gynecology

## 2018-03-18 ENCOUNTER — Ambulatory Visit: Payer: BC Managed Care – PPO | Admitting: Obstetrics & Gynecology

## 2018-04-11 ENCOUNTER — Ambulatory Visit: Payer: BC Managed Care – PPO | Admitting: Obstetrics & Gynecology

## 2018-07-11 ENCOUNTER — Encounter: Payer: Self-pay | Admitting: Advanced Practice Midwife

## 2018-07-11 ENCOUNTER — Ambulatory Visit (INDEPENDENT_AMBULATORY_CARE_PROVIDER_SITE_OTHER): Payer: BC Managed Care – PPO | Admitting: Advanced Practice Midwife

## 2018-07-11 ENCOUNTER — Other Ambulatory Visit: Payer: Self-pay

## 2018-07-11 VITALS — BP 132/88 | Wt 197.0 lb

## 2018-07-11 DIAGNOSIS — R300 Dysuria: Secondary | ICD-10-CM | POA: Diagnosis not present

## 2018-07-11 DIAGNOSIS — E669 Obesity, unspecified: Secondary | ICD-10-CM | POA: Insufficient documentation

## 2018-07-11 DIAGNOSIS — N3000 Acute cystitis without hematuria: Secondary | ICD-10-CM | POA: Diagnosis not present

## 2018-07-11 DIAGNOSIS — B379 Candidiasis, unspecified: Secondary | ICD-10-CM

## 2018-07-11 DIAGNOSIS — R350 Frequency of micturition: Secondary | ICD-10-CM

## 2018-07-11 DIAGNOSIS — E66811 Obesity, class 1: Secondary | ICD-10-CM | POA: Insufficient documentation

## 2018-07-11 LAB — POCT URINALYSIS DIPSTICK
BILIRUBIN UA: NEGATIVE
Blood, UA: NEGATIVE
Glucose, UA: NEGATIVE
KETONES UA: NEGATIVE
Leukocytes, UA: NEGATIVE
Nitrite, UA: NEGATIVE
PH UA: 6 (ref 5.0–8.0)
Protein, UA: POSITIVE — AB
Spec Grav, UA: 1.015 (ref 1.010–1.025)
UROBILINOGEN UA: NEGATIVE U/dL — AB

## 2018-07-11 MED ORDER — CEPHALEXIN 500 MG PO CAPS: 500.0000 mg | ORAL_CAPSULE | Freq: Two times a day (BID) | ORAL | 0 refills | Status: AC

## 2018-07-11 MED ORDER — FLUCONAZOLE 150 MG PO TABS: 150.0000 mg | ORAL_TABLET | Freq: Once | ORAL | 1 refills | Status: AC

## 2018-07-11 NOTE — Progress Notes (Signed)
Patient ID: Crystal Pearson, female   DOB: 04-May-1980, 38 y.o.   MRN: [id number]  Reason for Visit: Urinary Tract Infection (Urine frequency/incontenance)   Referred by Carol Ada, MD  Subjective:     HPI:  Crystal Pearson is a 38 y.o. female being seen today for urinary frequency, pressure and some burning with urination. Her symptoms began on Friday of last week. She had recently had intercourse and also took a long bath which she believes contributed to her symptoms. She has had a UTI in the past with similar symptoms. Her urine has been darker than usual the past few mornings despite her usual intake of h2o. She has been taking the cranberry AZO without any relief. She requests antibiotic today and medication for yeast infection as she is prone to yeast following antibiotics.   She has had a red rash on her face recently and has been seen by her PCP. She has had a laser treatment and has follow up with dermatology.   Past Medical History:  Diagnosis Date   Chest pain    Endometriosis    Family history of heart disease    Scoliosis    Family History  Problem Relation Age of Onset   Hypertension Mother    Cancer Father    Heart disease Father    Past Surgical History:  Procedure Laterality Date   ABDOMINAL HYSTERECTOMY     APPENDECTOMY     BACK SURGERY     Harrington Rod placement   CESAREAN SECTION     x2   endometrial surgery     FOOT SURGERY     OOPHORECTOMY      Short Social History:  Social History   Tobacco Use   Smoking status: Never Smoker   Smokeless tobacco: Never Used  Substance Use Topics   Alcohol use: No    No Known Allergies  Current Outpatient Medications  Medication Sig Dispense Refill   cephALEXin (KEFLEX) 500 MG capsule Take 1 capsule (500 mg total) by mouth 2 (two) times daily for 7 days. 14 capsule 0   fluconazole (DIFLUCAN) 150 MG tablet Take 1 tablet (150 mg total) by mouth once for 1 dose. Can take additional dose  three days later if symptoms persist 1 tablet 1   FLUoxetine (PROZAC) 10 MG capsule      phentermine (ADIPEX-P) 37.5 MG tablet Take 37.5 mg by mouth every morning.     No current facility-administered medications for this visit.     Review of Systems  Constitutional: Negative.   HENT: Negative.   Eyes: Negative.   Respiratory: Negative.   Cardiovascular: Negative.   Gastrointestinal: Negative.   Genitourinary: Positive for dysuria and frequency.  Musculoskeletal: Negative.   Skin: Positive for rash.       Redness on face  Neurological: Negative.   Endo/Heme/Allergies: Negative.   Psychiatric/Behavioral: Negative.         Objective:  Objective   Vitals:   07/11/18 1328  BP: 132/88  Weight: 197 lb (89.4 kg)   Body mass index is 32.78 kg/m. Constitutional: Well nourished, well developed female in no acute distress.  HEENT: normal Skin: Warm and dry.    Respiratory: Normal respiratory effort Back: no CVAT Neuro: DTRs 2+, Cranial nerves grossly intact Psych: Alert and Oriented x3. No memory deficits. Normal mood and affect.   Data: Results for HAUNANI, DICKARD (MRN [id number]) as of 07/11/2018 13:39  Ref. Range 07/11/2018 13:36  Bilirubin, UA Unknown NEgative  Clarity, UA Unknown Clear  Color, UA Unknown yellow  Glucose Latest Ref Range: Negative  Negative  Ketones, UA Unknown Negative  Leukocytes, UA Latest Ref Range: Negative  Negative  Nitrite, UA Unknown Negative  pH, UA Latest Ref Range: 5.0 - 8.0  6.0  Protein, UA Latest Ref Range: Negative  Positive (A)  Specific Gravity, UA Latest Ref Range: 1.010 - 1.025  1.015  Urobilinogen, UA Latest Ref Range: 0.2 or 1.0 E.U./dL negative (A)  RBC, UA Unknown Negative       Assessment/Plan:     38 year old G37 P2 female with dysuria and presumed UTI  Rx Keflex Rx Diflucan Return to clinic as needed and for annual exam   [place]

## 2018-07-13 LAB — URINE CULTURE

## 2019-01-19 ENCOUNTER — Other Ambulatory Visit: Payer: Self-pay | Admitting: Family Medicine

## 2019-01-19 DIAGNOSIS — Z1231 Encounter for screening mammogram for malignant neoplasm of breast: Secondary | ICD-10-CM

## 2019-02-13 ENCOUNTER — Ambulatory Visit: Payer: BC Managed Care – PPO | Admitting: Obstetrics & Gynecology

## 2019-07-23 ENCOUNTER — Other Ambulatory Visit: Payer: Self-pay

## 2019-07-23 ENCOUNTER — Ambulatory Visit
Admission: EM | Admit: 2019-07-23 | Discharge: 2019-07-23 | Disposition: A | Discharge status: Home / Self Care | Payer: BC Managed Care – PPO | Attending: Urgent Care | Admitting: Urgent Care

## 2019-07-23 DIAGNOSIS — H663X1 Other chronic suppurative otitis media, right ear: Secondary | ICD-10-CM

## 2019-07-23 DIAGNOSIS — J069 Acute upper respiratory infection, unspecified: Secondary | ICD-10-CM | POA: Insufficient documentation

## 2019-07-23 DIAGNOSIS — H6641 Suppurative otitis media, unspecified, right ear: Secondary | ICD-10-CM | POA: Diagnosis present

## 2019-07-23 DIAGNOSIS — J029 Acute pharyngitis, unspecified: Secondary | ICD-10-CM

## 2019-07-23 LAB — GROUP A STREP BY PCR: Group A Strep by PCR: NOT DETECTED

## 2019-07-23 MED ORDER — AMOXICILLIN-POT CLAVULANATE 875-125 MG PO TABS: 1.0000 | ORAL_TABLET | Freq: Two times a day (BID) | ORAL | 0 refills | Status: AC

## 2019-07-23 NOTE — Discharge Instructions (Addendum)
It was very nice seeing you today in clinic. Thank you for entrusting me with your care.   Rest and Stay HYDRATED. Water and electrolyte containing beverages (Gatorade, Pedialyte) are best to prevent dehydration and electrolyte abnormalities.   Recommend warm salt water gargles, hard candies/lozenges, and hot tea with honey/lemon to help soothe the throat and reduce irritation. May use Tylenol and/or Ibuprofen as needed for pain/fever.    Please utilize the medications that we discussed. Your prescriptions has been called in to your pharmacy.   Make arrangements to follow up with your regular doctor in 1 week for re-evaluation if not improving. If your symptoms/condition worsens, please seek follow up care either here or in the ER. Please remember, our [facility] providers are "right here with you" when you need [place].   Again, it was my pleasure to take care of you today. Thank you for choosing our clinic. I hope that you start to feel better quickly.   Honor Loh, MSN, APRN, FNP-C, CEN Advanced Practice Provider [facility]

## 2019-07-23 NOTE — ED Triage Notes (Addendum)
Pt generalized bodyaches and sore throat. Sx started yesterday. Son had similar last week and is getting better with Z-pack. Negative COVID test on March 5th. Son who was sick had negative COVID test

## 2019-07-24 NOTE — ED Provider Notes (Signed)
[place], [place]   Name: Crystal Pearson DOB: 1980/12/27 MRN: [id number] CSN: [id number] PCP: Carol Ada, MD  Arrival date and time:  07/23/19 1451  Chief Complaint:  Sore Throat and Generalized Body Aches  NOTE: Prior to seeing the patient today, I have reviewed the triage nursing documentation and vital signs. Clinical staff has updated patient's PMH/PSHx, current medication list, and drug allergies/intolerances to ensure comprehensive history available to assist in medical decision making.   History:   HPI: Crystal OBLANDER is a 39 y.o. female who presents today with complaints of fatigue, generalized myalgias, sore throat, and RIGHT otalgia that started yesterday. Patient denies fevers. She denies any cough, shortness of breath, or wheezing. She denies that she has experienced any nausea, vomiting, diarrhea, or abdominal pain. She is eating and drinking well. Patient denies any perceived alterations to her sense of taste or smell. Patient reporting that her son is currently on antibiotics for a respiratory infection. She also presents today with another son who has a similar symptom constellation. She has been tested for SARS-CoV-2 (novel coronavirus) in the past 14 days.  She has never been tested for SARS-CoV-2 (novel coronavirus) in the past per her report. Patient has been vaccinated for influenza this season. Despite her symptoms, patient has not taken any over the counter interventions to help improve/relieve her reported symptoms at home.   Past Medical History:  Diagnosis Date  . Chest pain   . Endometriosis   . Family history of heart disease   . Scoliosis     Past Surgical History:  Procedure Laterality Date  . ABDOMINAL HYSTERECTOMY    . APPENDECTOMY    . BACK SURGERY     Harrington Rod placement  . CESAREAN SECTION     x2  . endometrial surgery    . FOOT SURGERY    . OOPHORECTOMY      Family History  Problem Relation Age of Onset  . Hypertension Mother   . Cancer  Father   . Heart disease Father     Social History   Tobacco Use  . Smoking status: Never Smoker  . Smokeless tobacco: Never Used  Substance Use Topics  . Alcohol use: No  . Drug use: No    Patient Active Problem List   Diagnosis Date Noted  . Obesity (BMI 30.0-34.9) 07/11/2018  . 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  . Scoliosis (and kyphoscoliosis), idiopathic 07/27/2012    Home Medications:    No outpatient medications have been marked as taking for the 07/23/19 encounter [facility] Encounter).    Allergies:   Patient has no known allergies.  Review of Systems (ROS):  Review of systems NEGATIVE unless otherwise noted in narrative H&P section.   Vital Signs: Today's Vitals   07/23/19 1505 07/23/19 1506 07/23/19 1551  BP: (!) 138/94    Pulse: 88    Resp: 18    Temp: 98 F (36.7 C)    TempSrc: Oral    SpO2: 100%    Weight:  200 lb (90.7 kg)   Height:  5\' 5"  (1.651 m)   PainSc: 5   5     Physical Exam: Physical Exam  Constitutional: She is oriented to person, place, and time and well-developed, well-nourished, and in no distress.  HENT:  Head: Normocephalic and atraumatic.  Right Ear: There is tenderness. Tympanic membrane is erythematous and bulging (mild). A middle ear effusion (suppurative) is present.  Left Ear: Tympanic membrane normal.  Nose:  Nose normal.  Mouth/Throat: Uvula is midline. Posterior oropharyngeal erythema present. No oropharyngeal exudate or posterior oropharyngeal edema.  Eyes: Pupils are equal, round, and reactive to light.  Cardiovascular: Normal rate, regular rhythm, normal heart sounds and intact distal pulses.  Pulmonary/Chest: Effort normal and breath sounds normal.  No cough noted in clinic. No SOB or increased WOB. No distress. Able to speak in complete sentences without difficulties. SPO2 100% on RA.  Lymphadenopathy:       Head (right side): Submandibular and tonsillar adenopathy present.  Neurological:  She is alert and oriented to person, place, and time. Gait normal.  Skin: Skin is warm and dry. No rash noted. She is not diaphoretic.  Psychiatric: Mood, memory, affect and judgment normal.  Nursing note and vitals reviewed.   Urgent Care Treatments / Results:   Orders Placed This Encounter  Procedures  . Group A Strep by PCR    LABS: PLEASE NOTE: all labs that were ordered this encounter are listed, however only abnormal results are displayed. Labs Reviewed  GROUP A STREP BY PCR    EKG: -None  RADIOLOGY: No results found.  PROCEDURES: Procedures  MEDICATIONS RECEIVED THIS VISIT: Medications - No data to display  PERTINENT CLINICAL COURSE NOTES/UPDATES:   Initial Impression / Assessment and Plan / Urgent Care Course:  Pertinent labs & imaging results that were available during my care of the patient were personally reviewed by me and considered in my medical decision making (see lab/imaging section of note for values and interpretations).  Crystal Pearson is a 39 y.o. female who presents to [facility] today with complaints of Sore Throat and Generalized Body Aches  Patient overall well appearing and in no acute distress today in clinic. Presenting symptoms (see HPI) and exam as documented above. Symptoms began with acute onset. Patient has two child who are will with similar symptom constellation. Recently tested (-) for SARS-CoV-2; does not wish to be retested. Exam reveals suppurative effusion in her RIGHT ear. Oropharynx is significantly erythematous. PCR streptococcal throat swab (-). Given the fact that child ren are also sick, coupled with findings on her clinical exam, it is reasonable to proceed with treatment for URI, pharyngitis, and AOME on the RIGHT. Will treat patient with a 7 day course of amoxicillin-clavulanate. Discussed supportive care measures at home during acute phase of illness. Patient to rest as much as possible. She was encouraged to ensure  adequate hydration (water and ORS) to prevent dehydration and electrolyte derangements. Recommended warm salt water gargles, hard candies/lozenges, and hot tea with honey/lemon to help soothe the throat and reduce irritation. May use Tylenol and/or Ibuprofen as needed for pain/fever.    Discussed follow up with primary care physician in 1 week for re-evaluation. I have reviewed the follow up and strict return precautions for any new or worsening symptoms. Patient is aware of symptoms that would be deemed urgent/emergent, and would thus require further evaluation either here or in the emergency department. At the time of discharge, she verbalized understanding and consent with the discharge plan as it was reviewed with her. All questions were fielded by provider and/or clinic staff prior to patient discharge.    Final Clinical Impressions / Urgent Care Diagnoses:   Final diagnoses:  Upper respiratory tract infection, unspecified type  Suppurative otitis media of right ear, unspecified chronicity  Pharyngitis, unspecified etiology    New Prescriptions:  [place] Controlled Substance Registry consulted? Not Applicable  Meds ordered this encounter  Medications  .  amoxicillin-clavulanate (AUGMENTIN) 875-125 MG tablet    Sig: Take 1 tablet by mouth 2 (two) times daily for 7 days.    Dispense:  14 tablet    Refill:  0    Recommended Follow up Care:  Patient encouraged to follow up with the following provider within the specified time frame, or sooner as dictated by the severity of her symptoms. As always, she was instructed that for any urgent/emergent care needs, she should seek care either here or in the emergency department for more immediate evaluation.  NOTE: This note was prepared using Lobbyist along with smaller Company secretary. Despite my best ability to proofread, there is the potential that transcriptional errors may still occur from this process, and are completely  unintentional.    Karen Kitchens, NP 07/24/19 2249

## 2019-08-02 ENCOUNTER — Other Ambulatory Visit: Payer: Self-pay | Admitting: Obstetrics & Gynecology

## 2019-08-02 DIAGNOSIS — Z1231 Encounter for screening mammogram for malignant neoplasm of breast: Secondary | ICD-10-CM

## 2019-08-08 ENCOUNTER — Other Ambulatory Visit: Payer: Self-pay

## 2019-08-08 ENCOUNTER — Other Ambulatory Visit (HOSPITAL_COMMUNITY)
Admission: RE | Admit: 2019-08-08 | Discharge: 2019-08-08 | Disposition: A | Discharge status: Home / Self Care | Payer: BC Managed Care – PPO | Source: Ambulatory Visit | Attending: Obstetrics & Gynecology | Admitting: Obstetrics & Gynecology

## 2019-08-08 ENCOUNTER — Encounter: Payer: Self-pay | Admitting: Obstetrics & Gynecology

## 2019-08-08 ENCOUNTER — Ambulatory Visit (INDEPENDENT_AMBULATORY_CARE_PROVIDER_SITE_OTHER): Payer: BC Managed Care – PPO | Admitting: Obstetrics & Gynecology

## 2019-08-08 VITALS — BP 102/60 | Ht 65.0 in | Wt 198.0 lb

## 2019-08-08 DIAGNOSIS — Z124 Encounter for screening for malignant neoplasm of cervix: Secondary | ICD-10-CM | POA: Diagnosis present

## 2019-08-08 DIAGNOSIS — E6609 Other obesity due to excess calories: Secondary | ICD-10-CM

## 2019-08-08 DIAGNOSIS — Z6832 Body mass index (BMI) 32.0-32.9, adult: Secondary | ICD-10-CM | POA: Diagnosis not present

## 2019-08-08 DIAGNOSIS — Z01419 Encounter for gynecological examination (general) (routine) without abnormal findings: Secondary | ICD-10-CM

## 2019-08-08 MED ORDER — PHENTERMINE HCL 37.5 MG PO TABS: ORAL_TABLET | ORAL | 0 refills | Status: DC

## 2019-08-08 NOTE — Patient Instructions (Signed)
Phentermine tablets or capsules What is this medicine? PHENTERMINE (FEN ter meen) decreases your appetite. It is used with a reduced calorie diet and exercise to help you lose weight. This medicine may be used for other purposes; ask your health care provider or pharmacist if you have questions. COMMON BRAND NAME(S): Adipex-P, Atti-Plex P, Atti-Plex P Spansule, Fastin, Lomaira, Pro-Fast, Tara-8 What should I tell my health care provider before I take this medicine? They need to know if you have any of these conditions:  agitation or nervousness  diabetes  glaucoma  heart disease  high blood pressure  history of drug abuse or addiction  history of stroke  kidney disease  lung disease called Primary Pulmonary Hypertension (PPH)  taken an MAOI like Carbex, Eldepryl, Marplan, Nardil, or Parnate in last 14 days  taking stimulant medicines for attention disorders, weight loss, or to stay awake  thyroid disease  an unusual or allergic reaction to phentermine, other medicines, foods, dyes, or preservatives  pregnant or trying to get pregnant  breast-feeding How should I use this medicine? Take this medicine by mouth with a glass of water. Follow the directions on the prescription label. Take your medicine at regular intervals. Do not take it more often than directed. Do not stop taking except on your doctor's advice. Talk to your pediatrician regarding the use of this medicine in children. While this drug may be prescribed for children 17 years or older for selected conditions, precautions do apply. Overdosage: If you think you have taken too much of this medicine contact a poison control center or emergency room at once. NOTE: This medicine is only for you. Do not share this medicine with others. What if I miss a dose? If you miss a dose, take it as soon as you can. If it is almost time for your next dose, take only that dose. Do not take double or extra doses. What may interact  with this medicine? Do not take this medicine with any of the following medications:  MAOIs like Carbex, Eldepryl, Marplan, Nardil, and Parnate This medicine may also interact with the following medications:  alcohol  certain medicines for depression, anxiety, or psychotic disorders  certain medicines for high blood pressure  linezolid  medicines for colds or breathing difficulties like pseudoephedrine or phenylephrine  medicines for diabetes  sibutramine  stimulant medicines for attention disorders, weight loss, or to stay awake This list may not describe all possible interactions. Give your health care provider a list of all the medicines, herbs, non-prescription drugs, or dietary supplements you use. Also tell them if you smoke, drink alcohol, or use illegal drugs. Some items may interact with your medicine. What should I watch for while using this medicine? Visit your doctor or health care provider for regular checks on your progress. Do not stop taking except on your health care provider's advice. You may develop a severe reaction. Your health care provider will tell you how much medicine to take. Do not take this medicine close to bedtime. It may prevent you from sleeping. You may get drowsy or dizzy. Do not drive, use machinery, or do anything that needs mental alertness until you know how this medicine affects you. Do not stand or sit up quickly, especially if you are an older patient. This reduces the risk of dizzy or fainting spells. Alcohol may increase dizziness and drowsiness. Avoid alcoholic drinks. This medicine may affect blood sugar levels. Ask your healthcare provider if changes in diet or medicines are needed  if you have diabetes. Women should inform their health care provider if they wish to become pregnant or think they might be pregnant. Losing weight while pregnant is not advised and may cause harm to the unborn child. Talk to your health care provider for more  information. What side effects may I notice from receiving this medicine? Side effects that you should report to your doctor or health care professional as soon as possible:  allergic reactions like skin rash, itching or hives, swelling of the face, lips, or tongue  breathing problems  changes in emotions or moods  changes in vision  chest pain or chest tightness  fast, irregular heartbeat  feeling faint or lightheaded  increased blood pressure  irritable  restlessness  tremors  seizures  signs and symptoms of a stroke like changes in vision; confusion; trouble speaking or understanding; severe headaches; sudden numbness or weakness of the face, arm or leg; trouble walking; dizziness; loss of balance or coordination  unusually weak or tired Side effects that usually do not require medical attention (report to your doctor or health care professional if they continue or are bothersome):  changes in taste  constipation or diarrhea  dizziness  dry mouth  headache  trouble sleeping  upset stomach This list may not describe all possible side effects. Call your doctor for medical advice about side effects. You may report side effects to FDA at 1-800-FDA-1088. Where should I keep my medicine? Keep out of the reach of children. This medicine can be abused. Keep your medicine in a safe place to protect it from theft. Do not share this medicine with anyone. Selling or giving away this medicine is dangerous and against the law. This medicine may cause harm and death if it is taken by other adults, children, or pets. Return medicine that has not been used to an official disposal site. Contact the DEA at 1-800-882-9539 or your city/county government to find a site. If you cannot return the medicine, mix any unused medicine with a substance like cat litter or coffee grounds. Then throw the medicine away in a sealed container like a sealed bag or coffee can with a lid. Do not use the  medicine after the expiration date. Store at room temperature between 20 and 25 degrees C (68 and 77 degrees F). Keep container tightly closed. NOTE: This sheet is a summary. It may not cover all possible information. If you have questions about this medicine, talk to your doctor, pharmacist, or health care provider.  2020 Elsevier/Gold Standard (2019-02-17 12:54:20)  

## 2019-08-08 NOTE — Progress Notes (Signed)
HPI:      [name] is a 39 y.o. G2P0002 who LMP was No LMP recorded. Patient has had a hysterectomy. [facility]) she presents today for her annual examination. The patient has no complaints today. The patient is sexually active. Her last pap: approximate date 2016 and was normal and she has planned MMG for tomorrow. The patient does perform self breast exams.  There is no notable family history of breast or ovarian cancer in her family.  The patient has regular exercise: yes.  The patient denies current symptoms of depression.    GYN History: Contraception: status post hysterectomy  PMHx: Past Medical History:  Diagnosis Date  . Chest pain   . Endometriosis   . Family history of heart disease   . Scoliosis    Past Surgical History:  Procedure Laterality Date  . ABDOMINAL HYSTERECTOMY    . APPENDECTOMY    . BACK SURGERY     Harrington Rod placement  . CESAREAN SECTION     x2  . endometrial surgery    . FOOT SURGERY    . KNEE SURGERY    . OOPHORECTOMY     Family History  Problem Relation Age of Onset  . Hypertension Mother   . Cancer Father   . Heart disease Father    Social History   Tobacco Use  . Smoking status: Never Smoker  . Smokeless tobacco: Never Used  Substance Use Topics  . Alcohol use: No  . Drug use: No    Current Outpatient Medications:  .  phentermine (ADIPEX-P) 37.5 MG tablet, One tablet po in morning., Disp: 30 tablet, Rfl: 0 Allergies: Patient has no known allergies.  Review of Systems  Constitutional: Negative for chills, fever and malaise/fatigue.  HENT: Negative for congestion, sinus pain and sore throat.   Eyes: Negative for blurred vision and pain.  Respiratory: Negative for cough and wheezing.   Cardiovascular: Negative for chest pain and leg swelling.  Gastrointestinal: Negative for abdominal pain, constipation, diarrhea, heartburn, nausea and vomiting.  Genitourinary: Negative for dysuria, frequency, hematuria and urgency.    Musculoskeletal: Positive for back pain and joint pain. Negative for myalgias and neck pain.  Skin: Negative for itching and rash.  Neurological: Negative for dizziness, tremors and weakness.  Endo/Heme/Allergies: Does not bruise/bleed easily.  Psychiatric/Behavioral: Negative for depression. The patient is not nervous/anxious and does not have insomnia.     Objective: BP 102/60   Ht 5\' 5"  (1.651 m)   Wt 198 lb (89.8 kg)   BMI 32.95 kg/m   Filed Weights   08/08/19 1435  Weight: 198 lb (89.8 kg)   Body mass index is 32.95 kg/m. Physical Exam Constitutional:      General: She is not in acute distress.    Appearance: She is well-developed.  Genitourinary:     Pelvic exam was performed with patient supine.     Vagina and rectum normal.     No lesions in the vagina.     No vaginal bleeding.     No cervical motion tenderness, friability, lesion or polyp.     Uterus is absent.     No right or left adnexal mass present.     Right adnexa not tender.     Left adnexa not tender.  HENT:     Head: Normocephalic and atraumatic. No laceration.     Right Ear: Hearing normal.     Left Ear: Hearing normal.     Mouth/Throat:  Pharynx: Uvula midline.  Eyes:     Pupils: Pupils are equal, round, and reactive to light.  Neck:     Thyroid: No thyromegaly.  Cardiovascular:     Rate and Rhythm: Normal rate and regular rhythm.     Heart sounds: No murmur. No friction rub. No gallop.   Pulmonary:     Effort: Pulmonary effort is normal. No respiratory distress.     Breath sounds: Normal breath sounds. No wheezing.  Chest:     Breasts:        Right: No mass, skin change or tenderness.        Left: No mass, skin change or tenderness.  Abdominal:     General: Bowel sounds are normal. There is no distension.     Palpations: Abdomen is soft.     Tenderness: There is no abdominal tenderness. There is no rebound.  Musculoskeletal:        General: Normal range of motion.     Cervical back:  Normal range of motion and neck supple.  Neurological:     Mental Status: She is alert and oriented to person, place, and time.     Cranial Nerves: No cranial nerve deficit.  Skin:    General: Skin is warm and dry.  Psychiatric:        Judgment: Judgment normal.  Vitals reviewed.     Assessment:  ANNUAL EXAM 1. Women's annual routine gynecological examination   2. Screening for cervical cancer   3. Class 1 obesity due to excess calories without serious comorbidity with body mass index (BMI) of 32.0 to 32.9 in adult      Screening Plan:            1.  Cervical Screening-  Pap smear done today  2. Breast screening- Exam annually and mammogram>40 planned   3. Colonoscopy every 10 years, Hemoccult testing - after age 62  4. Labs managed by PCP   5. Class 1 obesity due to excess calories without serious comorbidity with body mass index (BMI) of 32.0 to 32.9 in adult Plan Phentermine as sell as dietary and exercise practices    F/U  Return in about 4 weeks (around 09/05/2019) for Follow up virtual.  Barnett Applebaum, MD, [facility], [facility] 08/08/2019  3:11 PM

## 2019-08-10 LAB — CYTOLOGY - PAP
Comment: NEGATIVE
Diagnosis: NEGATIVE
High risk HPV: NEGATIVE

## 2019-08-11 ENCOUNTER — Ambulatory Visit: Payer: BC Managed Care – PPO | Admitting: Dermatology

## 2019-08-11 ENCOUNTER — Ambulatory Visit
Admission: RE | Admit: 2019-08-11 | Discharge: 2019-08-11 | Disposition: A | Discharge status: Home / Self Care | Payer: BC Managed Care – PPO | Source: Ambulatory Visit | Attending: Obstetrics & Gynecology | Admitting: Obstetrics & Gynecology

## 2019-08-11 DIAGNOSIS — Z1231 Encounter for screening mammogram for malignant neoplasm of breast: Secondary | ICD-10-CM | POA: Insufficient documentation

## 2019-09-05 ENCOUNTER — Encounter: Payer: Self-pay | Admitting: Obstetrics & Gynecology

## 2019-09-05 ENCOUNTER — Other Ambulatory Visit: Payer: Self-pay

## 2019-09-05 ENCOUNTER — Ambulatory Visit (INDEPENDENT_AMBULATORY_CARE_PROVIDER_SITE_OTHER): Payer: BC Managed Care – PPO | Admitting: Obstetrics & Gynecology

## 2019-09-05 VITALS — Ht 65.0 in | Wt 190.0 lb

## 2019-09-05 DIAGNOSIS — E6609 Other obesity due to excess calories: Secondary | ICD-10-CM | POA: Diagnosis not present

## 2019-09-05 DIAGNOSIS — Z6832 Body mass index (BMI) 32.0-32.9, adult: Secondary | ICD-10-CM

## 2019-09-05 MED ORDER — PHENTERMINE HCL 37.5 MG PO TABS: ORAL_TABLET | ORAL | 1 refills | Status: DC

## 2019-09-05 NOTE — Progress Notes (Signed)
Virtual Visit via Telephone Note  I connected with Crystal Pearson on 09/05/19 at  3:10 PM EDT by telephone and verified that I am speaking with the correct person using two identifiers.   I discussed the limitations, risks, security and privacy concerns of performing an evaluation and management service by telephone and the availability of in person appointments. I also discussed with the patient that there may be a patient responsible charge related to this service. The patient expressed understanding and agreed to proceed. She was at home and I was in my office.  History of Present Illness: Crystal Pearson is a 39 y.o. who was started on Phentermine approximately 1 month ago due to obesity/abnormal weight gain. The patient has lost 8 pounds over the past month due to exercise and meds..   She has these side effects: none.  PMHx: She  has a past medical history of Chest pain, Endometriosis, Family history of heart disease, and Scoliosis. Also,  has a past surgical history that includes Abdominal hysterectomy; Appendectomy; Oophorectomy; Back surgery; Foot surgery; endometrial surgery; Cesarean section; and Knee surgery., family history includes Cancer in her father; Heart disease in her father; Hypertension in her mother.,  reports that she has never smoked. She has never used smokeless tobacco. She reports that she does not drink alcohol or use drugs.  She has a current medication list which includes the following prescription(s): phentermine and [DISCONTINUED] fluoxetine. Also, has No Known Allergies.  Review of Systems  All other systems reviewed and are negative.  Observations/Objective: No exam today, due to telephone eVisit due to [facility] virus restriction on elective visits and procedures.  Prior visits reviewed along with ultrasounds/labs as indicated. Home weight: Ht 5\' 5"  (1.651 m)   Wt 190 lb (86.2 kg)   BMI 31.62 kg/m   Assessment and Plan:   ICD-10-CM   1. Class 1 obesity due to  excess calories without serious comorbidity with body mass index (BMI) of 32.0 to 32.9 in adult  E66.09    Z68.32   Assessment: obesity Medication treatment is going well for her.  Plan: Patient is continued/added to prescription appetite suppressants: Phentermine.   Will continue to assist patient in incorporating positive experiences into her life to promote a positive mental attitude.  Education given regarding appropriate lifestyle changes for weight loss, including regular physical activity, healthy coping strategies, caloric restriction, and healthy eating patterns.  The risks and benefits as well as side effects of medication, such as Phenteramine or Tenuate, is discussed.  The pros and cons of suppressing appetite and boosting metabolism is counseled.  Risks of tolerance and addiction discussed.  Use of medicine will be short term.  Pt to call with any negative side effects and agrees to keep follow up appointments.   Follow Up Instructions: 2 mos   I discussed the assessment and treatment plan with the patient. The patient was provided an opportunity to ask questions and all were answered. The patient agreed with the plan and demonstrated an understanding of the instructions.   The patient was advised to call back or seek an in-person evaluation if the symptoms worsen or if the condition fails to improve as anticipated.  I provided 13 minutes of non-face-to-face time during this encounter.   Hoyt Koch, MD

## 2019-10-18 ENCOUNTER — Other Ambulatory Visit: Payer: Self-pay | Admitting: Family Medicine

## 2019-10-18 ENCOUNTER — Ambulatory Visit
Admission: RE | Admit: 2019-10-18 | Discharge: 2019-10-18 | Disposition: A | Discharge status: Home / Self Care | Payer: BC Managed Care – PPO | Source: Ambulatory Visit | Attending: Family Medicine | Admitting: Family Medicine

## 2019-10-18 DIAGNOSIS — R059 Cough, unspecified: Secondary | ICD-10-CM

## 2019-10-24 ENCOUNTER — Ambulatory Visit: Payer: BC Managed Care – PPO | Admitting: Dermatology

## 2019-10-31 ENCOUNTER — Telehealth: Payer: Self-pay

## 2019-10-31 ENCOUNTER — Other Ambulatory Visit: Payer: Self-pay | Admitting: Obstetrics & Gynecology

## 2019-10-31 MED ORDER — SULFAMETHOXAZOLE-TRIMETHOPRIM 800-160 MG PO TABS: 1.0000 | ORAL_TABLET | Freq: Two times a day (BID) | ORAL | 0 refills | Status: AC

## 2019-10-31 NOTE — Telephone Encounter (Signed)
Rx sent Bactrim for UTI to [place] on [address]

## 2019-10-31 NOTE — Telephone Encounter (Signed)
Pt calling for rx for UTI; [place]; no clinics; but there is a pharm down the road.  [phone]  Pt's sxs are pressure and frequency.  There is a Writer at [employer] ........ [place]. [place]  Phone [phone]  There are no minute clinics near her.  No AZO in the resort store.  Looked online for [employer] and found [place], [place] with the phone number of [phone]   I was unable to change pharm in chart.

## 2019-10-31 NOTE — Telephone Encounter (Signed)
Pt aware.

## 2019-11-08 ENCOUNTER — Ambulatory Visit: Payer: BC Managed Care – PPO | Admitting: Obstetrics & Gynecology

## 2019-11-20 ENCOUNTER — Ambulatory Visit: Payer: BC Managed Care – PPO | Admitting: Obstetrics & Gynecology

## 2020-04-10 ENCOUNTER — Ambulatory Visit: Payer: BC Managed Care – PPO | Admitting: Dermatology

## 2020-07-09 ENCOUNTER — Other Ambulatory Visit: Payer: Self-pay | Admitting: Family Medicine

## 2020-07-09 ENCOUNTER — Other Ambulatory Visit: Payer: Self-pay

## 2020-07-09 ENCOUNTER — Other Ambulatory Visit: Payer: Self-pay | Admitting: Obstetrics & Gynecology

## 2020-07-09 DIAGNOSIS — Z1231 Encounter for screening mammogram for malignant neoplasm of breast: Secondary | ICD-10-CM

## 2020-07-22 ENCOUNTER — Ambulatory Visit: Payer: BC Managed Care – PPO | Admitting: Dermatology

## 2020-08-07 ENCOUNTER — Ambulatory Visit: Payer: BC Managed Care – PPO | Admitting: Obstetrics

## 2020-08-07 ENCOUNTER — Other Ambulatory Visit (HOSPITAL_COMMUNITY)
Admission: RE | Admit: 2020-08-07 | Discharge: 2020-08-07 | Disposition: A | Discharge status: Home / Self Care | Payer: BC Managed Care – PPO | Source: Ambulatory Visit | Attending: Obstetrics | Admitting: Obstetrics

## 2020-08-07 ENCOUNTER — Encounter: Payer: Self-pay | Admitting: Obstetrics

## 2020-08-07 ENCOUNTER — Other Ambulatory Visit: Payer: Self-pay

## 2020-08-07 VITALS — BP 128/90 | Ht 65.0 in | Wt 191.0 lb

## 2020-08-07 DIAGNOSIS — N898 Other specified noninflammatory disorders of vagina: Secondary | ICD-10-CM | POA: Diagnosis present

## 2020-08-07 DIAGNOSIS — R399 Unspecified symptoms and signs involving the genitourinary system: Secondary | ICD-10-CM | POA: Insufficient documentation

## 2020-08-07 DIAGNOSIS — R300 Dysuria: Secondary | ICD-10-CM | POA: Diagnosis not present

## 2020-08-07 LAB — POCT URINALYSIS DIPSTICK
Protein, UA: POSITIVE — AB
Spec Grav, UA: 1.01 (ref 1.010–1.025)
Urobilinogen, UA: >=8 E.U./dL — AB

## 2020-08-07 MED ORDER — FLUCONAZOLE 150 MG PO TABS: 150.0000 mg | ORAL_TABLET | Freq: Once | ORAL | 0 refills | Status: AC

## 2020-08-07 NOTE — Progress Notes (Signed)
Obstetrics & Gynecology Office Visit   Chief Complaint:  Chief Complaint  Patient presents with  . Vaginal Discharge    Sour odor x few days    History of Present Illness: Crystal Pearson presents with c/o a vaginal discharge and concern that she may have a UTI.  Several days ago she notice increased discharge. She denies any itching symptoms or foul odor. She denies burning with urination, ans admits that with her having previous UTIs, she doesn't feel that today's symptoms are similar. She started back to exercising at the gym several times weekly this last month. She shares that she sweats a lot in her workout clothing. In addition, she uses vaginal douches post coitally, and  Is fastidious  with her personal hygiene.   Review of Systems:  Review of Systems  Constitutional: Negative.   HENT: Negative.   Eyes: Negative.   Cardiovascular: Negative.   Gastrointestinal: Negative.   Genitourinary: Negative.   Musculoskeletal: Negative.   Skin: Negative.   Endo/Heme/Allergies: Negative.   Psychiatric/Behavioral: Negative.      Past Medical History:  Past Medical History:  Diagnosis Date  . Chest pain   . Endometriosis   . Family history of heart disease   . Scoliosis     Past Surgical History:  Past Surgical History:  Procedure Laterality Date  . ABDOMINAL HYSTERECTOMY    . APPENDECTOMY    . BACK SURGERY     Harrington Rod placement  . CESAREAN SECTION     x2  . endometrial surgery    . FOOT SURGERY    . KNEE SURGERY    . OOPHORECTOMY      Gynecologic History: No LMP recorded. Patient has had a hysterectomy.  Obstetric History: G2P0002  Family History:  Family History  Problem Relation Age of Onset  . Hypertension Mother   . Cancer Father        bladder  . Heart disease Father   . Breast cancer Neg Hx     Social History:  Social History   Socioeconomic History  . Marital status: Married    Spouse name: Not on file  . Number of children: Not on file  .  Years of education: Not on file  . Highest education level: Not on file  Occupational History  . Not on file  Tobacco Use  . Smoking status: Never Smoker  . Smokeless tobacco: Never Used  Vaping Use  . Vaping Use: Never used  Substance and Sexual Activity  . Alcohol use: No  . Drug use: No  . Sexual activity: Yes    Birth control/protection: Surgical    Comment: Hysterectomy  Other Topics Concern  . Not on file  Social History Narrative  . Not on file   Social Determinants of Health   Financial Resource Strain: Not on file  Food Insecurity: Not on file  Transportation Needs: Not on file  Physical Activity: Not on file  Stress: Not on file  Social Connections: Not on file  Intimate Partner Violence: Not on file    Allergies:  No Known Allergies  Medications: Prior to Admission medications   Medication Sig Start Date End Date Taking? Authorizing Provider  fluconazole (DIFLUCAN) 150 MG tablet Take 1 tablet (150 mg total) by mouth once for 1 dose. Can take additional dose three days later if symptoms persist 08/07/20 08/07/20 Yes Imagene Riches, CNM  phentermine (ADIPEX-P) 37.5 MG tablet One tablet po in morning. Patient not taking: Reported on 08/07/2020  09/05/19   Gae Dry, MD  FLUoxetine (PROZAC) 10 MG capsule  07/06/18 07/23/19  [provider]    Physical Exam Vitals:  Vitals:   08/07/20 1058  BP: 128/90   No LMP recorded. Patient has had a hysterectomy.  Physical Exam Constitutional:      Appearance: Normal appearance. She is obese.  HENT:     Head: Normocephalic and atraumatic.  Cardiovascular:     Rate and Rhythm: Normal rate and regular rhythm.     Pulses: Normal pulses.     Heart sounds: Normal heart sounds.  Pulmonary:     Effort: Pulmonary effort is normal.     Breath sounds: Normal breath sounds.  Abdominal:     Palpations: Abdomen is soft.  Genitourinary:    General: Normal vulva.     Vagina: Vaginal discharge present.      Comments: Normal ext genitalia, no rashes or lesions noted. Scant whitish discharge, no malodor Normal vaginal mucosa.  Nuswab sent  Musculoskeletal:        General: Normal range of motion.     Cervical back: Normal range of motion and neck supple.  Skin:    General: Skin is warm and dry.  Neurological:     General: No focal deficit present.     Mental Status: She is alert and oriented to person, place, and time.  Psychiatric:        Mood and Affect: Mood normal.        Behavior: Behavior normal.   Assessment: 40 y.o. G2P0002  With new onset of vaginal discharge. ? Yeast vaginitis vs BV   Plan: Problem List Items Addressed This Visit   None   Visit Diagnoses    UTI symptoms    -  Primary   Relevant Orders   POCT Urinalysis Dipstick (Completed)   Dysuria       Vaginal discharge       Relevant Medications   fluconazole (DIFLUCAN) 150 MG tablet     Rx for Diflucan sent to pharmacy, and will follow up with the patient once her labs are back.  Advised not to use douches.Encouraged her to void post IC rather than using Burnell Blanks products.  Imagene Riches, CNM  08/07/2020 4:45 PM

## 2020-08-09 LAB — CERVICOVAGINAL ANCILLARY ONLY
Bacterial Vaginitis (gardnerella): NEGATIVE
Candida Glabrata: NEGATIVE
Candida Vaginitis: NEGATIVE
Comment: NEGATIVE
Comment: NEGATIVE
Comment: NEGATIVE

## 2020-08-11 ENCOUNTER — Encounter: Payer: Self-pay | Admitting: Obstetrics

## 2020-08-13 ENCOUNTER — Encounter: Payer: Self-pay | Admitting: Obstetrics

## 2020-08-13 NOTE — Progress Notes (Signed)
Called patient to let her know that her Nuswab result was negative for BV, yeast. She was prescribed Diflucan due to her c/o external irritation and concern that she had a yeast infection. She has a follow up appointment with Dr. Kenton Kingfisher. Left her a VM as she did not answer the call. Encouraged her to discuss any ongoing sxs with Dr. Kenton Kingfisher. Imagene Riches, CNM  08/13/2020 12:03 PM

## 2020-08-14 ENCOUNTER — Ambulatory Visit
Admission: RE | Admit: 2020-08-14 | Discharge: 2020-08-14 | Disposition: A | Discharge status: Home / Self Care | Payer: BC Managed Care – PPO | Source: Ambulatory Visit | Attending: Family Medicine | Admitting: Family Medicine

## 2020-08-14 ENCOUNTER — Other Ambulatory Visit: Payer: Self-pay

## 2020-08-14 DIAGNOSIS — Z1231 Encounter for screening mammogram for malignant neoplasm of breast: Secondary | ICD-10-CM | POA: Diagnosis present

## 2020-08-15 ENCOUNTER — Encounter: Payer: Self-pay | Admitting: Obstetrics & Gynecology

## 2020-08-21 ENCOUNTER — Ambulatory Visit: Payer: BC Managed Care – PPO | Admitting: Obstetrics & Gynecology

## 2020-08-26 ENCOUNTER — Ambulatory Visit: Payer: Self-pay | Admitting: Dermatology

## 2020-11-08 ENCOUNTER — Ambulatory Visit: Payer: BC Managed Care – PPO | Admitting: Obstetrics

## 2020-12-09 ENCOUNTER — Other Ambulatory Visit: Payer: Self-pay

## 2020-12-09 ENCOUNTER — Ambulatory Visit (INDEPENDENT_AMBULATORY_CARE_PROVIDER_SITE_OTHER): Payer: BC Managed Care – PPO | Admitting: Obstetrics & Gynecology

## 2020-12-09 ENCOUNTER — Encounter: Payer: Self-pay | Admitting: Obstetrics & Gynecology

## 2020-12-09 VITALS — BP 130/80 | Ht 65.0 in | Wt 198.0 lb

## 2020-12-09 DIAGNOSIS — E669 Obesity, unspecified: Secondary | ICD-10-CM

## 2020-12-09 DIAGNOSIS — Z01419 Encounter for gynecological examination (general) (routine) without abnormal findings: Secondary | ICD-10-CM | POA: Diagnosis not present

## 2020-12-09 MED ORDER — PHENTERMINE HCL 37.5 MG PO TABS: ORAL_TABLET | ORAL | 1 refills | Status: DC

## 2020-12-09 NOTE — Progress Notes (Signed)
HPI:      [name] is a 40 y.o. G2P0002 who LMP was No LMP recorded. Patient has had a hysterectomy., she presents today for her annual examination. The patient has no complaints today other than weight gain this year (due to stress) and occas night sweats (n other menopause sx's). The patient is sexually active. Her last pap: approximate date 2021 and was normal and last mammogram: approximate date 2022 and was normal. The patient does perform self breast exams.  There is no notable family history of breast or ovarian cancer in her family.  The patient has regular exercise: yes.  The patient denies current symptoms of depression.    GYN History: Contraception: status post hysterectomy  PMHx: Past Medical History:  Diagnosis Date   Chest pain    Endometriosis    Family history of heart disease    Scoliosis    Past Surgical History:  Procedure Laterality Date   ABDOMINAL HYSTERECTOMY     APPENDECTOMY     BACK SURGERY     Harrington Rod placement   CESAREAN SECTION     x2   endometrial surgery     FOOT SURGERY     KNEE SURGERY     OOPHORECTOMY     Family History  Problem Relation Age of Onset   Hypertension Mother    Cancer Father        bladder   Heart disease Father    Breast cancer Neg Hx    Social History   Tobacco Use   Smoking status: Never   Smokeless tobacco: Never  Vaping Use   Vaping Use: Never used  Substance Use Topics   Alcohol use: No   Drug use: No    Current Outpatient Medications:    phentermine (ADIPEX-P) 37.5 MG tablet, One tablet po in morning., Disp: 30 tablet, Rfl: 1 Allergies: Patient has no known allergies.  Review of Systems  Constitutional:  Negative for chills, fever and malaise/fatigue.  HENT:  Negative for congestion, sinus pain and sore throat.   Eyes:  Negative for blurred vision and pain.  Respiratory:  Negative for cough and wheezing.   Cardiovascular:  Negative for chest pain and leg swelling.  Gastrointestinal:   Negative for abdominal pain, constipation, diarrhea, heartburn, nausea and vomiting.  Genitourinary:  Negative for dysuria, frequency, hematuria and urgency.  Musculoskeletal:  Negative for back pain, joint pain, myalgias and neck pain.  Skin:  Negative for itching and rash.  Neurological:  Negative for dizziness, tremors and weakness.  Endo/Heme/Allergies:  Does not bruise/bleed easily.  Psychiatric/Behavioral:  Negative for depression. The patient is not nervous/anxious and does not have insomnia.    Objective: BP 130/80   Ht '5\' 5"'$  (1.651 m)   Wt 198 lb (89.8 kg)   BMI 32.95 kg/m   Filed Weights   12/09/20 1530  Weight: 198 lb (89.8 kg)   Body mass index is 32.95 kg/m. Physical Exam Constitutional:      General: She is not in acute distress.    Appearance: She is well-developed.  Genitourinary:     Vulva, bladder, rectum and urethral meatus normal.     No lesions in the vagina.     Genitourinary Comments: Vaginal cuff well healed     Right Labia: No rash, tenderness or lesions.    Left Labia: No tenderness, lesions or rash.    No vaginal bleeding.      Right Adnexa: not tender and no mass present.  Left Adnexa: not tender and no mass present.    No cervical friability or lesion.     Uterus is absent.     Pelvic exam was performed with patient in the lithotomy position.  Breasts:    Right: No mass, skin change or tenderness.     Left: No mass, skin change or tenderness.  HENT:     Head: Normocephalic and atraumatic. No laceration.     Right Ear: Hearing normal.     Left Ear: Hearing normal.     Mouth/Throat:     Pharynx: Uvula midline.  Eyes:     Pupils: Pupils are equal, round, and reactive to light.  Neck:     Thyroid: No thyromegaly.  Cardiovascular:     Rate and Rhythm: Normal rate and regular rhythm.     Heart sounds: No murmur heard.   No friction rub. No gallop.  Pulmonary:     Effort: Pulmonary effort is normal. No respiratory distress.     Breath  sounds: Normal breath sounds. No wheezing.  Abdominal:     General: Bowel sounds are normal. There is no distension.     Palpations: Abdomen is soft.     Tenderness: There is no abdominal tenderness. There is no rebound.  Musculoskeletal:        General: Normal range of motion.     Cervical back: Normal range of motion and neck supple.  Neurological:     Mental Status: She is alert and oriented to person, place, and time.     Cranial Nerves: No cranial nerve deficit.  Skin:    General: Skin is warm and dry.  Psychiatric:        Judgment: Judgment normal.  Vitals reviewed.    Assessment:  ANNUAL EXAM 1. Women's annual routine gynecological examination   2. Obesity (BMI 30.0-34.9)      Screening Plan:            1.  Cervical Screening-  Pap smear schedule reviewed with patient, every 3 years  2. Breast screening- Exam annually and mammogram>40 planned   3. Colonoscopy every 10 years, Hemoccult testing - after age 53  4. Labs managed by PCP  5. Counseling for contraception:  none needed  6. Obesity (BMI 30.0-34.9) Restart meds for a period of time; exercise and diet discussed - phentermine (ADIPEX-P) 37.5 MG tablet; One tablet po in morning.  Dispense: 30 tablet; Refill: 1      F/U  Return in about 1 year (around 12/09/2021) for Annual.  Barnett Applebaum, MD, [facility], [facility] 12/09/2020  3:54 PM

## 2021-01-25 DIAGNOSIS — Z87442 Personal history of urinary calculi: Secondary | ICD-10-CM

## 2021-01-25 HISTORY — DX: Personal history of urinary calculi: Z87.442

## 2021-02-12 ENCOUNTER — Telehealth: Payer: Self-pay

## 2021-02-12 NOTE — Telephone Encounter (Addendum)
Pt called after hour nurse 02/11/21 10:49pm c/o having abd pain, feels like her insides are falling out; had a sharp pain 3wks ago but it went away; today it has been nonstop since this morning, feels like pressure; took IBU but it hasn't helped; pain is 8/10; has had a partial hyst.  [phone]  Adv nurse adv pt to go to ED.  Pt currently admitted to ED at [facility].

## 2021-02-13 ENCOUNTER — Other Ambulatory Visit: Payer: Self-pay

## 2021-02-13 ENCOUNTER — Ambulatory Visit: Payer: BC Managed Care – PPO | Admitting: Advanced Practice Midwife

## 2021-02-13 ENCOUNTER — Encounter: Payer: Self-pay | Admitting: Advanced Practice Midwife

## 2021-02-13 VITALS — BP 126/84 | Ht 65.0 in | Wt 203.0 lb

## 2021-02-13 DIAGNOSIS — Z09 Encounter for follow-up examination after completed treatment for conditions other than malignant neoplasm: Secondary | ICD-10-CM | POA: Diagnosis not present

## 2021-02-13 NOTE — Progress Notes (Signed)
Patient ID: Crystal Pearson, female   DOB: Apr 30, 1980, 40 y.o.   MRN: [id number]  Reason for Visit: Follow-up   Subjective:  HPI:  Crystal Pearson is a 40 y.o. female being seen for follow up after ER visit yesterday. She has had 2 episodes of pelvic pain recently. The first episode was a couple weeks ago and was brief. Tuesday night she felt it again with the feeling that her "insides were coming out". The pain was constant and at times sharp. She also has difficulty at times emptying her bladder and at other times urinary incontinence. She admits constipation. She denies N/V, F/C, SOB, chest pain, symptoms of UTI or vaginitis. Imaging done at the ER was remarkable for: a small likely benign cyst on left ovary, small renal stone, small hiatal hernia, nabothian cysts on cervix. She reports severe pain with transvaginal u/s especially lower right. Generally and currently her pain is across her lower abdomen especially at the site of cesarean incision and radiating to the left.   Past Medical History:  Diagnosis Date   Chest pain    Endometriosis    Family history of heart disease    Scoliosis    Family History  Problem Relation Age of Onset   Hypertension Mother    Cancer Father        bladder   Heart disease Father    Breast cancer Neg Hx    Past Surgical History:  Procedure Laterality Date   ABDOMINAL HYSTERECTOMY     APPENDECTOMY     BACK SURGERY     Harrington Rod placement   CESAREAN SECTION     x2   endometrial surgery     FOOT SURGERY     KNEE SURGERY     OOPHORECTOMY      Short Social History:  Social History   Tobacco Use   Smoking status: Never   Smokeless tobacco: Never  Substance Use Topics   Alcohol use: No    No Known Allergies  Current Outpatient Medications  Medication Sig Dispense Refill   phentermine (ADIPEX-P) 37.5 MG tablet One tablet po in morning. (Patient not taking: Reported on 02/13/2021) 30 tablet 1   No current facility-administered  medications for this visit.    Review of Systems  Constitutional:  Negative for chills and fever.  HENT:  Negative for congestion, ear discharge, ear pain, hearing loss, sinus pain and sore throat.   Eyes:  Negative for blurred vision and double vision.  Respiratory:  Negative for cough, shortness of breath and wheezing.   Cardiovascular:  Negative for chest pain, palpitations and leg swelling.  Gastrointestinal:  Positive for abdominal pain. Negative for blood in stool, constipation, diarrhea, heartburn, melena, nausea and vomiting.  Genitourinary:  Positive for urgency. Negative for dysuria, flank pain, frequency and hematuria.       Positive for vaginal pain  Musculoskeletal:  Negative for back pain, joint pain and myalgias.  Skin:  Negative for itching and rash.  Neurological:  Negative for dizziness, tingling, tremors, sensory change, speech change, focal weakness, seizures, loss of consciousness, weakness and headaches.  Endo/Heme/Allergies:  Negative for environmental allergies. Does not bruise/bleed easily.  Psychiatric/Behavioral:  Negative for depression, hallucinations, memory loss, substance abuse and suicidal ideas. The patient is not nervous/anxious and does not have insomnia.        Objective:  Objective   Vitals:   02/13/21 0915  BP: 126/84  Weight: 203 lb (92.1 kg)  Height: 5\' 5"  (1.651  m)   Body mass index is 33.78 kg/m. Constitutional: Well nourished, well developed female in no acute distress.  HEENT: normal Skin: Warm and dry.  Extremity:  no edema   Respiratory: Normal respiratory effort Abdomen: soft, nontender, nondistended, no abnormal masses, no epigastric pain Neuro: DTRs 2+, Cranial nerves grossly intact Psych: Alert and Oriented x3. No memory deficits. Normal mood and affect.   Pelvic exam:  is not limited by body habitus EGBUS: within normal limits Vagina: mild bladder bulging, decreased muscle strength   Assessment/Plan:     40 y.o. G2  P2 female with pelvic pain, ER follow up, essentially normal imaging (see [facility] notes)  Recommend Kegel exercises Follow up with MD as needed for further evaluation of prolapse and worsening pain Constipation: increase water, fiber, exercise, probiotics   [facility] 02/13/2021, 1:38 PM

## 2021-02-22 ENCOUNTER — Other Ambulatory Visit: Payer: Self-pay

## 2021-02-22 ENCOUNTER — Ambulatory Visit
Admission: EM | Admit: 2021-02-22 | Discharge: 2021-02-22 | Disposition: A | Discharge status: Home / Self Care | Payer: BC Managed Care – PPO

## 2021-02-22 DIAGNOSIS — J039 Acute tonsillitis, unspecified: Secondary | ICD-10-CM | POA: Diagnosis not present

## 2021-02-22 MED ORDER — CEFDINIR 300 MG PO CAPS: 300.0000 mg | ORAL_CAPSULE | Freq: Two times a day (BID) | ORAL | 0 refills | Status: AC

## 2021-02-22 NOTE — ED Provider Notes (Signed)
CHIEF COMPLAINT:   Chief Complaint  Patient presents with   Cough   Sore Throat     SUBJECTIVE/HPI:   Cough Sore Throat  A very pleasant 40 y.o.Female presents today with cough, sore throat that started last week.  Patient has been tested for COVID, influenza, had a chest x-ray, and RSV all negative.  Patient is treating symptoms with Tamiflu and hydrocodone cough medicine along with Tylenol. Patient does not report any shortness of breath, chest pain, palpitations, visual changes, weakness, tingling, headache, nausea, vomiting, diarrhea, fever, chills.   has a past medical history of Chest pain, Endometriosis, Family history of heart disease, and Scoliosis.  ROS:  Review of Systems  Respiratory:  Positive for cough.   See Subjective/HPI Medications, Allergies and Problem List personally reviewed in Epic today OBJECTIVE:   Vitals:   02/22/21 0929  BP: (!) 132/97  Pulse: 96  Resp: 16  Temp: 97.7 F (36.5 C)  SpO2: 95%    Physical Exam   General: Appears well-developed and well-nourished. No acute distress.  HEENT Head: Normocephalic and atraumatic.   Ears: Hearing grossly intact, no drainage or visible deformity.  Nose: No nasal deviation.   Mouth/Throat: No stridor or tracheal deviation.  + erythematous posterior pharynx noted with clear drainage present.  + white patchy exudate noted. Eyes: Conjunctivae and EOM are normal. No eye drainage or scleral icterus bilaterally.  Neck: Normal range of motion, neck is supple. + bilateral anterior cervical lymph nodes palpated.  Tender, mobile and soft. Cardiovascular: Normal rate. Regular rhythm; no murmurs, gallops, or rubs.  Pulm/Chest: No respiratory distress. Breath sounds normal bilaterally without wheezes, rhonchi, or rales.  Neurological: Alert and oriented to person, place, and time.  Skin: Skin is warm and dry.  No rashes, lesions, abrasions or bruising noted to skin.   Psychiatric: Normal mood, affect, behavior, and  thought content.   Vital signs and nursing note reviewed.   Patient stable and cooperative with examination. PROCEDURES:    LABS/X-RAYS/EKG/MEDS:   No results found for any visits on 02/22/21.  MEDICAL DECISION MAKING:   Patient presents with cough, sore throat that started last week.  Patient has been tested for COVID, influenza, had a chest x-ray, and RSV all negative.  Patient is treating symptoms with Tamiflu and hydrocodone cough medicine along with Tylenol. Patient does not report any shortness of breath, chest pain, palpitations, visual changes, weakness, tingling, headache, nausea, vomiting, diarrhea, fever, chills.  Given symptoms along with assessment findings, likely acute tonsillitis.  Rx cefdinir to the patient's preferred pharmacy and advised about home treatment and care to include fluids, Tylenol, ibuprofen and throwing away her toothbrush after 2 days of treatment along with warm salt gargles.  Return for any significant worsening of pain, persistent fever, drooling or neck stiffness.  Patient verbalized understanding and agreed with treatment plan.  Patient stable upon discharge. ASSESSMENT/PLAN:  1. Acute tonsillitis, unspecified etiology - cefdinir (OMNICEF) 300 MG capsule; Take 1 capsule (300 mg total) by mouth 2 (two) times daily for 10 days.  Dispense: 20 capsule; Refill: 0 Instructions about new medications and side effects provided.  Plan:   Discharge Instructions      I recommend the following for at-home care:   Push fluids (water, Gatorade, Pedialyte).   Tylenol or ibuprofen for pain or fever.  Warm salt water gargles for throat pain as needed.  Throw away toothbrush after 2 days of treatment.   Be sure to complete all of your antibiotic, even if  you are feeling better sooner.  Be advised that you will be infectious for 24 hours after starting antibiotics.  Follow up with PCP if not improving in 2 - 3 days.   Return for significant worsening of pain,  persistent fever, drooling, or neck stiffness.           Serafina Royals, [place] 02/22/21 [id number]

## 2021-02-22 NOTE — Discharge Instructions (Signed)
I recommend the following for at-home care:   Push fluids (water, Gatorade, Pedialyte).   Tylenol or ibuprofen for pain or fever.  Warm salt water gargles for throat pain as needed.  Throw away toothbrush after 2 days of treatment.   Be sure to complete all of your antibiotic, even if you are feeling better sooner.  Be advised that you will be infectious for 24 hours after starting antibiotics.  Follow up with PCP if not improving in 2 - 3 days.   Return for significant worsening of pain, persistent fever, drooling, or neck stiffness.

## 2021-02-22 NOTE — ED Triage Notes (Signed)
Patient presents to Urgent Care with complaints of cough, sore throat since 1 week. Tested for Covid, flu, had chest x-ray, and RSV all test negative. Treating symptoms with Tamiflu, hydrocodeine cough med, tylenol.   Denies fever.

## 2021-02-23 ENCOUNTER — Ambulatory Visit: Payer: Self-pay

## 2021-02-24 ENCOUNTER — Encounter: Payer: Self-pay | Admitting: Obstetrics & Gynecology

## 2021-02-24 ENCOUNTER — Other Ambulatory Visit: Payer: Self-pay

## 2021-02-24 ENCOUNTER — Telehealth (INDEPENDENT_AMBULATORY_CARE_PROVIDER_SITE_OTHER): Payer: BC Managed Care – PPO | Admitting: Obstetrics & Gynecology

## 2021-02-24 DIAGNOSIS — R102 Pelvic and perineal pain: Secondary | ICD-10-CM | POA: Diagnosis not present

## 2021-02-24 DIAGNOSIS — N393 Stress incontinence (female) (male): Secondary | ICD-10-CM | POA: Diagnosis not present

## 2021-02-24 NOTE — Progress Notes (Signed)
Virtual Visit via Telephone Note  I connected with Crystal Pearson on 02/24/21 at 11:20 AM EDT by telephone and verified that I am speaking with the correct person using two identifiers.  Location: Patient: Home (has Flu) Provider: Office   I discussed the limitations, risks, security and privacy concerns of performing an evaluation and management service by telephone and the availability of in person appointments. I also discussed with the patient that there may be a patient responsible charge related to this service. The patient expressed understanding and agreed to proceed.  History of Present Illness:  Gynecology Evaluation   Chief Complaint: Abdominal pin, Leakage of urine  History of Present Illness:   Patient is a 40 y.o. G2P0002, presents today for a problem visit.  The patient is not menopausal, but is s/p hysterectomy and RSO in past. She complains of ONE MONTH HISTORY of LOWER PELVIC PAINS, no radiation or additional sx's other than also having urinary symptoms of urinary frequency and urinary incontinence. Patient reports her symptoms began about a month ago and its severity is described as  worse w recent flu she is suffering from (more LOU w cough and sneeze) .  Her symptoms include frequency.  She is having leakage of urine with stressful activities, such as coughing, sneezing, laughter, or exercise.  She is not having leakage of urine based on symptoms of urgency or bladder spasm/discomfort.  She is not having nocturia, with 1-2 episodes per night.  She is not using pads or diapers daily for control of symptoms. She is not having dysuria.  She has not had a history of UTI.  She drinks about 1 caffeinated drinks per day.  She has not had prior procedures for incontinence.  PMHx: She  has a past medical history of Chest pain, Endometriosis, Family history of heart disease, and Scoliosis. Also,  has a past surgical history that includes Abdominal hysterectomy; Appendectomy;  Oophorectomy; Back surgery; Foot surgery; endometrial surgery; Cesarean section; and Knee surgery., family history includes Cancer in her father; Heart disease in her father; Hypertension in her mother.,  reports that she has never smoked. She has never used smokeless tobacco. She reports that she does not drink alcohol and does not use drugs.  She has a current medication list which includes the following prescription(s): cefdinir, escitalopram, hydrocodone bit-homatropine, oseltamivir, phentermine, and [DISCONTINUED] fluoxetine. Also, has No Known Allergies.  Review of Systems  Constitutional:  Negative for chills, fever and malaise/fatigue.  HENT:  Negative for congestion, sinus pain and sore throat.   Eyes:  Negative for blurred vision and pain.  Respiratory:  Negative for cough and wheezing.   Cardiovascular:  Negative for chest pain and leg swelling.  Gastrointestinal:  Positive for abdominal pain. Negative for constipation, diarrhea, heartburn, nausea and vomiting.  Genitourinary:  Positive for frequency. Negative for dysuria, hematuria and urgency.  Musculoskeletal:  Negative for back pain, joint pain, myalgias and neck pain.  Skin:  Negative for itching and rash.  Neurological:  Negative for dizziness, tremors and weakness.  Endo/Heme/Allergies:  Does not bruise/bleed easily.  Psychiatric/Behavioral:  Negative for depression. The patient is not nervous/anxious and does not have insomnia.   All other systems reviewed and are negative.   Observations/Objective: No exam today, due to telephone eVisit due to [facility] virus restriction on elective visits and procedures.  Prior visits reviewed along with ultrasounds/labs as indicated.  Assessment and Plan: 1. Pelvic pain CT and [place] normal (small stable left ovarian cyst 1.6cm). Pain out of  proportion to being cystocele or GSI alone   2. Stress incontinence Exam next in person visit. Discussed options for treatment, such as pessary use or  surgery.  Pt has already performed Kegels without success.  Follow Up Instructions: Appt soon   I discussed the assessment and treatment plan with the patient. The patient was provided an opportunity to ask questions and all were answered. The patient agreed with the plan and demonstrated an understanding of the instructions.   The patient was advised to call back or seek an in-person evaluation if the symptoms worsen or if the condition fails to improve as anticipated.  I provided 15 minutes of non-face-to-face time during this encounter.   Hoyt Koch, MD

## 2021-02-26 ENCOUNTER — Other Ambulatory Visit (HOSPITAL_COMMUNITY)
Admission: RE | Admit: 2021-02-26 | Discharge: 2021-02-26 | Disposition: A | Discharge status: Home / Self Care | Payer: BC Managed Care – PPO | Source: Ambulatory Visit | Attending: Obstetrics & Gynecology | Admitting: Obstetrics & Gynecology

## 2021-02-26 ENCOUNTER — Ambulatory Visit: Payer: BC Managed Care – PPO | Admitting: Obstetrics & Gynecology

## 2021-02-26 ENCOUNTER — Other Ambulatory Visit: Payer: Self-pay

## 2021-02-26 ENCOUNTER — Encounter: Payer: Self-pay | Admitting: Obstetrics & Gynecology

## 2021-02-26 VITALS — BP 130/80 | Ht 65.0 in | Wt 202.0 lb

## 2021-02-26 DIAGNOSIS — N393 Stress incontinence (female) (male): Secondary | ICD-10-CM | POA: Diagnosis not present

## 2021-02-26 DIAGNOSIS — R102 Pelvic and perineal pain: Secondary | ICD-10-CM

## 2021-02-26 LAB — POCT URINALYSIS DIPSTICK
Bilirubin, UA: NEGATIVE
Blood, UA: POSITIVE
Glucose, UA: NEGATIVE
Ketones, UA: NEGATIVE
Nitrite, UA: NEGATIVE
Protein, UA: NEGATIVE
Spec Grav, UA: 1.01 (ref 1.010–1.025)
Urobilinogen, UA: 0.2 E.U./dL
pH, UA: 5 (ref 5.0–8.0)

## 2021-02-26 MED ORDER — FLAVOXATE HCL 100 MG PO TABS: 100.0000 mg | ORAL_TABLET | Freq: Three times a day (TID) | ORAL | 1 refills | Status: DC | PRN

## 2021-02-26 MED ORDER — SULFAMETHOXAZOLE-TRIMETHOPRIM 800-160 MG PO TABS: 1.0000 | ORAL_TABLET | Freq: Two times a day (BID) | ORAL | 0 refills | Status: AC

## 2021-02-26 NOTE — Progress Notes (Signed)
HPI:  Patient is a 40 y.o. [id number] presenting for assessment of her pelvic pain and incontinence. She reports lower pelvic pains, both sides and more so than centrally, although also feels like uterine cramps (she is s/p hysterectomy).  Associated w urinary freq. She has long standing h/o stress incontinence (such as w trampoline, extreme exercise) but over the last 2 mos has noticed it is more frequently with any stress, such as coughing, laughter, sneezing, activity, gym exercise.  Has had to stop activities.  Goes thru pads and clothes frequently due to incontinence.  Usually occurs w stress, but can occur while sitting too.  No urgency or spasm.  No nocturia.  No fever.  No dysuria.  PMHx: She  has a past medical history of Chest pain, Endometriosis, Family history of heart disease, and Scoliosis. Also,  has a past surgical history that includes Abdominal hysterectomy; Appendectomy; Oophorectomy; Back surgery; Foot surgery; endometrial surgery; Cesarean section; and Knee surgery., family history includes Cancer in her father; Heart disease in her father; Hypertension in her mother.,  reports that she has never smoked. She has never used smokeless tobacco. She reports that she does not drink alcohol and does not use drugs.  She has a current medication list which includes the following prescription(s): cefdinir, escitalopram, hydrocodone bit-homatropine, phentermine, oseltamivir, and [DISCONTINUED] fluoxetine. Also, has No Known Allergies.  Review of Systems  Constitutional:  Negative for chills, fever and malaise/fatigue.  HENT:  Negative for congestion, sinus pain and sore throat.   Eyes:  Negative for blurred vision and pain.  Respiratory:  Negative for cough and wheezing.   Cardiovascular:  Negative for chest pain and leg swelling.  Gastrointestinal:  Positive for abdominal pain. Negative for constipation, diarrhea, heartburn, nausea and vomiting.  Genitourinary:  Positive for frequency.  Negative for dysuria, hematuria and urgency.  Musculoskeletal:  Negative for back pain, joint pain, myalgias and neck pain.  Skin:  Negative for itching and rash.  Neurological:  Negative for dizziness, tremors and weakness.  Endo/Heme/Allergies:  Does not bruise/bleed easily.  Psychiatric/Behavioral:  Negative for depression. The patient is not nervous/anxious and does not have insomnia.    Objective: BP 130/80   Ht 5\' 5"  (1.651 m)   Wt 202 lb (91.6 kg)   BMI 33.61 kg/m  Filed Weights   02/26/21 1603  Weight: 202 lb (91.6 kg)   Body mass index is 33.61 kg/m.  UA POS for blood and leuks today  Physical examination Physical Exam Constitutional:      General: She is not in acute distress.    Appearance: She is well-developed.  Genitourinary:     Bladder and urethral meatus normal.     Genitourinary Comments: Cuff intact/ no lesions Tender ant vag wall on exam Absent uterus and cervix Cystocele Gr 1, mostly distal     Vaginal cuff intact.    No vaginal erythema or bleeding.     Anterior vaginal prolapse present.    No vaginal atrophy present.     Right Adnexa: not tender and no mass present.    Left Adnexa: not tender and no mass present.    Cervix is absent.     Uterus is absent.     Bladder is tender and urgency on palpation.     Pelvic exam was performed with patient in the lithotomy position.  HENT:     Head: Normocephalic and atraumatic.     Nose: Nose normal.  Abdominal:     General: There is no  distension.     Palpations: Abdomen is soft.     Tenderness: There is no abdominal tenderness.  Musculoskeletal:        General: Normal range of motion.  Neurological:     Mental Status: She is alert and oriented to person, place, and time.     Cranial Nerves: No cranial nerve deficit.  Skin:    General: Skin is warm and dry.  Psychiatric:        Attention and Perception: Attention normal.        Mood and Affect: Mood normal.        Speech: Speech normal.         Behavior: Behavior normal.        Cognition and Memory: Cognition normal.        Judgment: Judgment normal.    ASSESSMENT:     ICD-10-CM   1. Pelvic pain  R10.2     2. Stress incontinence  N39.3        Plan: 1. UTI.  Treat w Bactrim.  Also Urispas.   Urine culture sent today.  2. Pelvic pain.  Although complains mostly about GSI, this would not cause the pelvic and bladder pain reproduced on exam.  May be from UTI.  WIll get urology assessment as well as this is complex. Vaginal cultures sent as well today.  3. GSI.  Pt very miserable w symptoms and desires surgical correction if that will help.  Discussed if isolated GSI, then sling type bladder support surgery may help.  Need to resolve pain etiology first.     A total of 22 minutes were spent face-to-face with the patient as well as preparation, review, communication, and documentation during this encounter.    Barnett Applebaum, MD, [facility], [facility] 02/26/2021  4:36 PM

## 2021-02-26 NOTE — Patient Instructions (Signed)
Urinary Tract Infection, Adult A urinary tract infection (UTI) is an infection of any part of the urinary tract. The urinary tract includes the kidneys, ureters, bladder, and urethra. These organs make, store, and get rid of urine in the body. An upper UTI affects the ureters and kidneys. A lower UTI affects the bladder and urethra. What are the causes? Most urinary tract infections are caused by bacteria in your genital area around your urethra, where urine leaves your body. These bacteria grow and cause inflammation of your urinary tract. What increases the risk? You are more likely to develop this condition if: You have a urinary catheter that stays in place. You are not able to control when you urinate or have a bowel movement (incontinence). You are female and you: Use a spermicide or diaphragm for birth control. Have low estrogen levels. Are pregnant. You have certain genes that increase your risk. You are sexually active. You take antibiotic medicines. You have a condition that causes your flow of urine to slow down, such as: An enlarged prostate, if you are female. Blockage in your urethra. A kidney stone. A nerve condition that affects your bladder control (neurogenic bladder). Not getting enough to drink, or not urinating often. You have certain medical conditions, such as: Diabetes. A weak disease-fighting system (immunesystem). Sickle cell disease. Gout. Spinal cord injury. What are the signs or symptoms? Symptoms of this condition include: Needing to urinate right away (urgency). Frequent urination. This may include small amounts of urine each time you urinate. Pain or burning with urination. Blood in the urine. Urine that smells bad or unusual. Trouble urinating. Cloudy urine. Vaginal discharge, if you are female. Pain in the abdomen or the lower back. You may also have: Vomiting or a decreased appetite. Confusion. Irritability or tiredness. A fever or  chills. Diarrhea. The first symptom in older adults may be confusion. In some cases, they may not have any symptoms until the infection has worsened. How is this diagnosed? This condition is diagnosed based on your medical history and a physical exam. You may also have other tests, including: Urine tests. Blood tests. Tests for STIs (sexually transmitted infections). If you have had more than one UTI, a cystoscopy or imaging studies may be done to determine the cause of the infections. How is this treated? Treatment for this condition includes: Antibiotic medicine. Over-the-counter medicines to treat discomfort. Drinking enough water to stay hydrated. If you have frequent infections or have other conditions such as a kidney stone, you may need to see a health care provider who specializes in the urinary tract (urologist). In rare cases, urinary tract infections can cause sepsis. Sepsis is a life-threatening condition that occurs when the body responds to an infection. Sepsis is treated in the hospital with IV antibiotics, fluids, and other medicines. Follow these instructions at home: Medicines Take over-the-counter and prescription medicines only as told by your health care provider. If you were prescribed an antibiotic medicine, take it as told by your health care provider. Do not stop using the antibiotic even if you start to feel better. General instructions Make sure you: Empty your bladder often and completely. Do not hold urine for long periods of time. Empty your bladder after sex. Wipe from front to back after urinating or having a bowel movement if you are female. Use each tissue only one time when you wipe. Drink enough fluid to keep your urine pale yellow. Keep all follow-up visits. This is important. Contact a health care provider   if: Your symptoms do not get better after 1-2 days. Your symptoms go away and then return. Get help right away if: You have severe pain in your  back or your lower abdomen. You have a fever or chills. You have nausea or vomiting. Summary A urinary tract infection (UTI) is an infection of any part of the urinary tract, which includes the kidneys, ureters, bladder, and urethra. Most urinary tract infections are caused by bacteria in your genital area. Treatment for this condition often includes antibiotic medicines. If you were prescribed an antibiotic medicine, take it as told by your health care provider. Do not stop using the antibiotic even if you start to feel better. Keep all follow-up visits. This is important. This information is not intended to replace advice given to you by your health care provider. Make sure you discuss any questions you have with your health care provider. Document Revised: 11/24/2019 Document Reviewed: 11/24/2019 Elsevier Patient Education  2022 Elsevier Inc.  

## 2021-02-28 LAB — CERVICOVAGINAL ANCILLARY ONLY
Bacterial Vaginitis (gardnerella): NEGATIVE
Candida Glabrata: NEGATIVE
Candida Vaginitis: POSITIVE — AB
Chlamydia: NEGATIVE
Comment: NEGATIVE
Comment: NEGATIVE
Comment: NEGATIVE
Comment: NEGATIVE
Comment: NEGATIVE
Comment: NORMAL
Neisseria Gonorrhea: NEGATIVE
Trichomonas: NEGATIVE

## 2021-03-02 LAB — URINE CULTURE

## 2021-03-17 ENCOUNTER — Encounter: Payer: Self-pay | Admitting: Urology

## 2021-03-17 ENCOUNTER — Other Ambulatory Visit: Payer: Self-pay

## 2021-03-17 ENCOUNTER — Ambulatory Visit: Payer: BC Managed Care – PPO | Admitting: Urology

## 2021-03-17 VITALS — BP 147/90 | HR 85 | Ht 65.0 in | Wt 202.0 lb

## 2021-03-17 DIAGNOSIS — R3129 Other microscopic hematuria: Secondary | ICD-10-CM

## 2021-03-17 DIAGNOSIS — R102 Pelvic and perineal pain: Secondary | ICD-10-CM

## 2021-03-17 LAB — URINALYSIS, COMPLETE
Bilirubin, UA: NEGATIVE
Glucose, UA: NEGATIVE
Ketones, UA: NEGATIVE
Leukocytes,UA: NEGATIVE
Nitrite, UA: NEGATIVE
Protein,UA: NEGATIVE
Specific Gravity, UA: 1.02 (ref 1.005–1.030)
Urobilinogen, Ur: 0.2 mg/dL (ref 0.2–1.0)
pH, UA: 6 (ref 5.0–7.5)

## 2021-03-17 LAB — MICROSCOPIC EXAMINATION: Bacteria, UA: NONE SEEN

## 2021-03-17 NOTE — Progress Notes (Signed)
03/17/2021 2:05 PM   [name] Sherryll Burger 01/28/1981 [id number]  Referring provider: Gae Dry, MD [address] [place],  [place] [address]  No chief complaint on file.   HPI: I was consulted to assess the patient for potential microscopic hematuria and abdominal pain.  It sounds like for a few months she was having a lot of discomfort in the suprapubic area and abdominal bloating and the biggest precipitant was each time she would eat.  Things have settled down but she still has left lower quadrant discomfort.  She describes a very tender pelvic examination where her gynecologist said that he could do prolapse or bladder surgery but she was very tender and he want to know why she was having pain.  He told her she had blood in the urine.  She said her belly swells a lot and pointed to the upper abdomen  Normally she voids every few hours and has no nocturia.  She can leak with coughing jumping and laughing and changes her close.  On a good day does not wear a pad.  On a bad day wears 2 or 3 pads that are damp.  No urge incontinence or bedwetting  She has had back surgery years ago for scoliosis.  She has had a hysterectomy.  She has a history of endometriosis  She is sore in the suprapubic area after intercourse but not during  I reviewed the medical records and did not see a recent CT scan.  Recent urine culture negative  She has been told she had a kidney stone in the past.  No bladder surgery.  No history of infections.       PMH: Past Medical History:  Diagnosis Date   Chest pain    Endometriosis    Family history of heart disease    Scoliosis     Surgical History: Past Surgical History:  Procedure Laterality Date   ABDOMINAL HYSTERECTOMY     APPENDECTOMY     BACK SURGERY     Harrington Rod placement   CESAREAN SECTION     x2   endometrial surgery     FOOT SURGERY     KNEE SURGERY     OOPHORECTOMY      Home Medications:  Allergies as of 03/17/2021   No  Known Allergies      Medication List        Accurate as of March 17, 2021  2:05 PM. If you have any questions, ask your nurse or doctor.          STOP taking these medications    flavoxATE 100 MG tablet Commonly known as: URISPAS Stopped by: Reece Packer, MD   HYDROcodone bit-homatropine 5-1.5 MG/5ML syrup Commonly known as: HYCODAN Stopped by: Reece Packer, MD   oseltamivir 75 MG capsule Commonly known as: TAMIFLU Stopped by: Reece Packer, MD   phentermine 37.5 MG tablet Commonly known as: ADIPEX-P Stopped by: Reece Packer, MD       TAKE these medications    escitalopram 10 MG tablet Commonly known as: LEXAPRO Take 10 mg by mouth daily.        Allergies: No Known Allergies  Family History: Family History  Problem Relation Age of Onset   Hypertension Mother    Cancer Father        bladder   Heart disease Father    Breast cancer Neg Hx     Social History:  reports that she has never smoked.  She has never used smokeless tobacco. She reports that she does not drink alcohol and does not use drugs.  ROS:                                        Physical Exam: BP (!) 147/90   Pulse 85   Ht 5\' 5"  (1.651 m)   Wt 91.6 kg   BMI 33.61 kg/m   Constitutional:  Alert and oriented, No acute distress. HEENT: [place] AT, moist mucus membranes.  Trachea midline, no masses. Cardiovascular: No clubbing, cyanosis, or edema. Respiratory: Normal respiratory effort, no increased work of breathing. GI: Abdomen is soft, nontender, nondistended, no abdominal masses GU: No prolapse or stress incontinence.  Mild hypermobility the bladder neck.  Left levator muscle a bit tight and minimally tender.  No significant tender bladder or other trigger points Skin: No rashes, bruises or suspicious lesions. Lymph: No cervical or inguinal adenopathy. Neurologic: Grossly intact, no focal deficits, moving all 4 extremities. Psychiatric:  Normal mood and affect.  Laboratory Data: Lab Results  Component Value Date   WBC 9.5 07/14/2013   HGB 12.5 07/14/2013   HCT 38.1 07/14/2013   MCV 81 07/14/2013   PLT 294 07/14/2013    Lab Results  Component Value Date   CREATININE 0.91 07/14/2013    No results found for: PSA  No results found for: TESTOSTERONE  Lab Results  Component Value Date   HGBA1C 5.6 09/17/2017    Urinalysis    Component Value Date/Time   LABSPEC 1.020 01/29/2013 1837   PHURINE 6.0 01/29/2013 1837   GLUCOSEU NEGATIVE 01/29/2013 1837   HGBUR TRACE (A) 01/29/2013 1837   BILIRUBINUR Negative 02/26/2021 1646   KETONESUR NEGATIVE 01/29/2013 1837   PROTEINUR Negative 02/26/2021 1646   PROTEINUR NEGATIVE 01/29/2013 1837   UROBILINOGEN 0.2 02/26/2021 1646   UROBILINOGEN 0.2 01/29/2013 1837   NITRITE Negative 02/26/2021 1646   NITRITE NEGATIVE 01/29/2013 1837   LEUKOCYTESUR Moderate (2+) (A) 02/26/2021 1646    Pertinent Imaging: Microscopic hematuria.  Urine sent for culture.  Chart reviewed.  Assessment & Plan: I do not think the patient has interstitial cystitis but she might.  She does have microscopic hematuria.  CT scan ordered.  Reevaluate with cystoscopy.  At baseline she has minimal frequency and mild stress incontinence.  It would be important to know if she had interstitial cystitis if she was ever can have a sling.  Urodynamics could also be done to assess stress incontinence and look for evidence of painful bladder.  Hydrodistention in the future might be something to consider.  It would be interesting to see if she is hyperesthetic during cystoscopy  1. Pelvic pain in female  - Bladder Scan (Post Void Residual) in office   No follow-ups on file.  Reece Packer, MD  [place] [address] [place], [place] [address] [phone]

## 2021-03-17 NOTE — Addendum Note (Signed)
Addended by: Kyra Manges on: 03/17/2021 03:10 PM   Modules accepted: Orders

## 2021-03-17 NOTE — Patient Instructions (Signed)

## 2021-03-31 ENCOUNTER — Telehealth: Payer: Self-pay | Admitting: Urology

## 2021-03-31 NOTE — Telephone Encounter (Signed)
Pt called and wants to know if MacDiarmid can prescribe anything for stomach pain.  She works 2 jobs and Tylenol and Ibuprofen aren't helping.  She wants to know if he can give her anything a little stronger, but nothing that will make her sleepy.

## 2021-04-02 NOTE — Telephone Encounter (Signed)
Patient called back today and stated that she has not heard anything back from anyone.  She stated that she has questions about her results and would like for someone to call her back

## 2021-04-02 NOTE — Telephone Encounter (Signed)
Sw pt. Answered her questions. Pt verbalized understanding.

## 2021-04-08 ENCOUNTER — Ambulatory Visit
Admission: RE | Admit: 2021-04-08 | Discharge: 2021-04-08 | Disposition: A | Discharge status: Home / Self Care | Payer: BC Managed Care – PPO | Source: Ambulatory Visit | Attending: Urology | Admitting: Urology

## 2021-04-08 DIAGNOSIS — R102 Pelvic and perineal pain: Secondary | ICD-10-CM

## 2021-04-08 DIAGNOSIS — R3129 Other microscopic hematuria: Secondary | ICD-10-CM | POA: Diagnosis present

## 2021-04-08 MED ORDER — IOHEXOL 300 MG/ML  SOLN
150.0000 mL | Freq: Once | INTRAMUSCULAR | Status: AC | PRN
  Administered 2021-04-08: 150 mL via INTRAVENOUS

## 2021-04-09 ENCOUNTER — Telehealth: Payer: Self-pay | Admitting: Urology

## 2021-04-09 NOTE — Telephone Encounter (Signed)
Pt would like someone to call her today and give her results. She does not want to wait until Monday.

## 2021-04-10 NOTE — Telephone Encounter (Signed)
Left detailed message for pt 

## 2021-04-11 ENCOUNTER — Other Ambulatory Visit: Payer: BC Managed Care – PPO

## 2021-04-15 ENCOUNTER — Other Ambulatory Visit: Payer: Self-pay | Admitting: Obstetrics & Gynecology

## 2021-04-15 DIAGNOSIS — Z09 Encounter for follow-up examination after completed treatment for conditions other than malignant neoplasm: Secondary | ICD-10-CM

## 2021-04-15 DIAGNOSIS — Z90710 Acquired absence of both cervix and uterus: Secondary | ICD-10-CM

## 2021-04-15 NOTE — Progress Notes (Signed)
Pt still reports uncomfortable sx's of [place] Has seen urology for partial work up Also has had CT Reviewed Needs additional work up ([place]) as well as urology (for pain)  Barnett Applebaum, MD, [place], [facility] 04/15/2021  1:24 PM

## 2021-05-02 ENCOUNTER — Other Ambulatory Visit: Payer: Self-pay | Admitting: Obstetrics & Gynecology

## 2021-05-02 DIAGNOSIS — R935 Abnormal findings on diagnostic imaging of other abdominal regions, including retroperitoneum: Secondary | ICD-10-CM

## 2021-05-02 DIAGNOSIS — Z90710 Acquired absence of both cervix and uterus: Secondary | ICD-10-CM

## 2021-05-05 ENCOUNTER — Other Ambulatory Visit: Payer: Self-pay

## 2021-05-05 ENCOUNTER — Ambulatory Visit: Payer: BC Managed Care – PPO | Admitting: Urology

## 2021-05-05 VITALS — BP 147/88 | HR 68

## 2021-05-05 DIAGNOSIS — R102 Pelvic and perineal pain: Secondary | ICD-10-CM | POA: Diagnosis not present

## 2021-05-05 DIAGNOSIS — N3946 Mixed incontinence: Secondary | ICD-10-CM

## 2021-05-05 DIAGNOSIS — R3129 Other microscopic hematuria: Secondary | ICD-10-CM | POA: Diagnosis not present

## 2021-05-05 NOTE — Progress Notes (Signed)
05/05/2021 11:30 AM   Crystal Pearson Crystal Pearson 31-Mar-1981 [id number]  Referring provider: Carol Ada, MD [address],  [place] [address]  Chief Complaint  Patient presents with   Cysto    HPI: I was consulted to assess the patient for potential microscopic hematuria and abdominal pain.  It sounds like for a few months she was having a lot of discomfort in the suprapubic area and abdominal bloating and the biggest precipitant was each time she would eat.  Things have settled down but she still has left lower quadrant discomfort.  She describes a very tender pelvic examination where her gynecologist said that he could do prolapse or bladder surgery but she was very tender and he want to know why she was having pain.  He told her she had blood in the urine.  She said her belly swells a lot and pointed to the upper abdomen  Normally she voids every few hours and has no nocturia.  She can leak with coughing jumping and laughing and changes her close.  On a good day does not wear a pad.  On a bad day wears 2 or 3 pads that are damp.  No urge incontinence or bedwetting  She has had back surgery years ago for scoliosis.  She has had a hysterectomy.  She has a history of endometriosis   She is sore in the suprapubic area after intercourse but not during   Recent urine culture negative    No prolapse or stress incontinence.  Mild hypermobility the bladder neck.  Left levator muscle a bit tight and minimally tender.  No significant tender bladder or other trigger points  I do not think the patient has interstitial cystitis but she might.  She does have microscopic hematuria.  CT scan ordered.  Reevaluate with cystoscopy.  At baseline she has minimal frequency and mild stress incontinence.  It would be important to know if she had interstitial cystitis if she was ever can have a sling.  Urodynamics could also be done to assess stress incontinence and look for evidence of painful  bladder.  Hydrodistention in the future might be something to consider.  It would be interesting to see if she is hyperesthetic during cystoscopy  Today Frequency table.  Phone calls reviewed.  She had called in from pain medicine and for x-ray results.  CT scan performed April 09, 2021 demonstrated 1 to 2 mm stone right kidney not obstructing.  It appears that she has a pelvic ultrasound scheduled for tomorrow.  Still has left lower quadrant pain Cystoscopy: Patient underwent flexible cystoscopy.  Bladder mucosa and trigone were normal.  No cystitis.  No carcinoma.  Not tender.  No pain with bladder filling   PMH: Past Medical History:  Diagnosis Date   Chest pain    Endometriosis    Family history of heart disease    Scoliosis     Surgical History: Past Surgical History:  Procedure Laterality Date   ABDOMINAL HYSTERECTOMY     APPENDECTOMY     BACK SURGERY     Harrington Rod placement   CESAREAN SECTION     x2   endometrial surgery     FOOT SURGERY     KNEE SURGERY     OOPHORECTOMY      Home Medications:  Allergies as of 05/05/2021   No Known Allergies      Medication List        Accurate as of May 05, 2021 11:30 AM. If you have any questions, ask your nurse or doctor.          escitalopram 10 MG tablet Commonly known as: LEXAPRO Take 10 mg by mouth daily.   naproxen 500 MG tablet Commonly known as: NAPROSYN Take 500 mg by mouth 2 (two) times daily.   phentermine 37.5 MG tablet Commonly known as: ADIPEX-P Take 37.5 mg by mouth every morning.        Allergies: No Known Allergies  Family History: Family History  Problem Relation Age of Onset   Hypertension Mother    Cancer Father        bladder   Heart disease Father    Breast cancer Neg Hx     Social History:  reports that she has never smoked. She has never used smokeless tobacco. She reports that she does not drink alcohol and does not use drugs.  ROS:                                         Physical Exam: There were no vitals taken for this visit.  Constitutional:  Alert and oriented, No acute distress. HEENT: [place] AT, moist mucus membranes.  Trachea midline, no masses.   Laboratory Data: Lab Results  Component Value Date   WBC 9.5 07/14/2013   HGB 12.5 07/14/2013   HCT 38.1 07/14/2013   MCV 81 07/14/2013   PLT 294 07/14/2013    Lab Results  Component Value Date   CREATININE 0.91 07/14/2013    No results found for: PSA  No results found for: TESTOSTERONE  Lab Results  Component Value Date   HGBA1C 5.6 09/17/2017    Urinalysis    Component Value Date/Time   APPEARANCEUR Hazy (A) 03/17/2021 1512   LABSPEC 1.020 01/29/2013 1837   PHURINE 6.0 01/29/2013 1837   GLUCOSEU Negative 03/17/2021 1512   HGBUR TRACE (A) 01/29/2013 1837   BILIRUBINUR Negative 03/17/2021 1512   KETONESUR NEGATIVE 01/29/2013 1837   PROTEINUR Negative 03/17/2021 1512   PROTEINUR NEGATIVE 01/29/2013 1837   UROBILINOGEN 0.2 02/26/2021 1646   UROBILINOGEN 0.2 01/29/2013 1837   NITRITE Negative 03/17/2021 1512   NITRITE NEGATIVE 01/29/2013 1837   LEUKOCYTESUR Negative 03/17/2021 1512    Pertinent Imaging:   Assessment & Plan: I do not think the patient has interstitial cystitis but the role of hydrodistention discussed.  The role of urodynamics for mild stress incontinence discussed.  In my opinion discomfort is not from mild pelvic floor weakness.  She has been cleared for microscopic hematuria.  I went over hydrodistention with full template and I do not think she has interstitial cystitis and were not can proceed but she might call in the future  Her mild stress incontinence is not affecting her quality life and were not can proceed with urodynamics that we discussed in detail  She will see a gynecologist and even a gastroenterologist in the future because she gets a lot of diffuse symptoms after eating and bloating.  She was very  understanding today  1. Pelvic pain in female   2. Microscopic hematuria  - Urinalysis, Complete   No follow-ups on file.  Reece Packer, MD  [facility] [address], [place], [place] [address] [phone]

## 2021-05-06 ENCOUNTER — Ambulatory Visit: Admission: RE | Admit: 2021-05-06 | Payer: BC Managed Care – PPO | Source: Ambulatory Visit

## 2021-05-07 LAB — URINALYSIS, COMPLETE
Bilirubin, UA: NEGATIVE
Glucose, UA: NEGATIVE
Ketones, UA: NEGATIVE
Leukocytes,UA: NEGATIVE
Nitrite, UA: NEGATIVE
Protein,UA: NEGATIVE
Specific Gravity, UA: 1.015 (ref 1.005–1.030)
Urobilinogen, Ur: 0.2 mg/dL (ref 0.2–1.0)
pH, UA: 6.5 (ref 5.0–7.5)

## 2021-05-07 LAB — MICROSCOPIC EXAMINATION: Bacteria, UA: NONE SEEN

## 2021-05-08 ENCOUNTER — Telehealth: Payer: Self-pay

## 2021-05-08 NOTE — Telephone Encounter (Signed)
Pt calling; interersted in possible bladder tack surg; had u/s in Oct and doesn't feel like she needs another one.  Would like more detailed about this surgery.  [phone]

## 2021-05-09 NOTE — Telephone Encounter (Signed)
Schedule surgery then time for a preop for discussion of plan and related surgery topics.  If desires to talk before scheduling, then arrange tele appt. Outpatient procedure, 5 days out of work, 6 weeks no heavy lifting (if asks).  Surgery Booking Request Patient Full Name:  Crystal Pearson  MRN: [id number]  DOB: 13-Jan-1981  Surgeon: Hoyt Koch, MD  Requested Surgery Date and Time: Any Primary Diagnosis AND Code: Stress Urinary Incontinence Secondary Diagnosis and Code:  Surgical Procedure: Anterior repair, TVT Pubovaginal Sling Procedure, Cystoscopy RNFA Requested?: No, but do need an Assistant L&D Notification: No Admission Status: same day surgery Length of Surgery: 50 min Special Case Needs: Yes,   TVT EXACT H&P: Yes Phone Interview???:  Yes Interpreter: No Medical Clearance:  No Special Scheduling Instructions: No Any known health/anesthesia issues, diabetes, sleep apnea, latex allergy, defibrillator/pacemaker?: No Acuity: P3   (P1 highest, P2 delay may cause harm, P3 low, elective gyn, P4 lowest) Post op follow up visits:  yes

## 2021-05-22 ENCOUNTER — Other Ambulatory Visit: Payer: Self-pay | Admitting: Obstetrics & Gynecology

## 2021-05-22 DIAGNOSIS — N393 Stress incontinence (female) (male): Secondary | ICD-10-CM

## 2021-05-22 NOTE — Telephone Encounter (Signed)
Called patient to schedule Anterior repair, TVT Pubovaginal Sling Procedure, Cystoscopy w Kenton Kingfisher  DOS 3/2  H&P 2/21 @ 1:15   Pre-admit phone call appointment to be requested - date and time will be included on H&P paper work. Also all appointments will be updated on pt MyChart. Explained that this appointment has a call window. Based on the time scheduled will indicate if the call will be received within a 4 hour window before 1:00 or after.  Advised that pt may also receive calls from the hospital pharmacy and pre-service center.  Confirmed pt has [employer] as Chartered certified accountant. No secondary insurance.   I advised pt: Outpatient procedure, 5 days out of work, 6 weeks no heavy lifting per [facility]

## 2021-05-28 ENCOUNTER — Ambulatory Visit: Payer: BC Managed Care – PPO | Admitting: Dermatology

## 2021-06-16 ENCOUNTER — Telehealth: Payer: Self-pay

## 2021-06-16 NOTE — Telephone Encounter (Signed)
Called pt to change H&P appt with [facility] to 2/24 @ 1:55

## 2021-06-17 ENCOUNTER — Encounter: Payer: BC Managed Care – PPO | Admitting: Obstetrics & Gynecology

## 2021-06-20 ENCOUNTER — Other Ambulatory Visit: Payer: Self-pay

## 2021-06-20 ENCOUNTER — Encounter: Payer: Self-pay | Admitting: Obstetrics & Gynecology

## 2021-06-20 ENCOUNTER — Ambulatory Visit (INDEPENDENT_AMBULATORY_CARE_PROVIDER_SITE_OTHER): Payer: BC Managed Care – PPO | Admitting: Obstetrics & Gynecology

## 2021-06-20 VITALS — BP 128/82 | Ht 65.0 in | Wt 208.0 lb

## 2021-06-20 DIAGNOSIS — N393 Stress incontinence (female) (male): Secondary | ICD-10-CM

## 2021-06-20 NOTE — Patient Instructions (Signed)
Anterior and Posterior Colporrhaphy and Sling Procedure Anterior and posterior colporrhaphy and sling procedure are combined surgical procedures that treat weakness in the front wall (anterior) or back wall (posterior) of the vagina. This weakness can result in a condition called pelvic organ prolapse. Pelvic organ prolapse can cause urine leakage, a condition called incontinence. In this procedure, a surgical mesh sling is placed around the urethra. The urethra is the tube that empties urine from the bladder. The sling will hold the urethra and bladder in place to prevent urine leakage. This surgery is usually done through incisions in the vagina. It can also be done through incisions in the lower abdominal or groin area. You may need this surgery if pelvic organ prolapse causes symptoms that interfere with your daily life and cannot be corrected with other treatments. The type of colporrhaphy that is done depends on the type of prolapse. Types of pelvic organ prolapse include the following: Cystocele. This is a prolapse of the bladder and the upper part of the front wall of the vagina. Rectocele. This is a prolapse of the rectum and the lower part of the back wall of the vagina. Enterocele. This is a prolapse of the small intestine. It appears as a bulge under the neck of the uterus at the top of the back wall of the vagina. Procidentia. This is a complete prolapse of the uterus and the cervix. The prolapse can be seen and felt coming out of the vagina. Tell a health care provider about: Any allergies you have. All medicines you are taking, including vitamins, herbs, eye drops, creams, and over-the-counter medicines. Any problems you or family members have had with anesthetic medicines. Any blood disorders you have. Any surgeries you have had. Any medical conditions you have. History of smoking or alcohol use. Whether you are pregnant or may be pregnant. What are the risks? Generally, this is  a safe procedure. However, problems may occur, including: Infection. Bleeding. Allergic reactions to medicines. Damage to nearby structures, organs, or nerves. Incontinence. A blood clot that travels to your lung. Painful sex. Urine leakage into the vagina. Constipation. Tissue damage from the sling over time. The sling may need to be removed. What happens before the procedure? Staying hydrated Follow instructions from your health care provider about hydration, which may include: Up to 2 hours before the procedure - you may continue to drink clear liquids, such as water, clear fruit juice, black coffee, and plain tea.  Eating and drinking restrictions Follow instructions from your health care provider about eating and drinking, which may include: 8 hours before the procedure - stop eating heavy meals or foods, such as meat, fried foods, or fatty foods. 6 hours before the procedure - stop eating light meals or foods, such as toast or cereal. 6 hours before the procedure - stop drinking milk or drinks that contain milk. 2 hours before the procedure - stop drinking clear liquids. Medicines Ask your health care provider about: Changing or stopping your regular medicines. This is especially important if you are taking diabetes medicines or blood thinners. Taking medicines such as aspirin and ibuprofen. These medicines can thin your blood. Do not take these medicines unless your health care provider tells you to take them. Taking over-the-counter medicines, vitamins, herbs, and supplements. Surgery safety Ask your health care provider what steps will be taken to help prevent infection. These steps may include: Removing hair at the surgery site. Washing skin with a germ-killing soap. Taking antibiotic medicine. General instructions  You may be instructed to use estrogen cream in your vagina to help prevent complications and promote healing. Do not use any products that contain nicotine or  tobacco for at least 4 weeks before the procedure. These products include cigarettes, chewing tobacco, and vaping devices, such as e-cigarettes. If you need help quitting, ask your health care provider. Plan to have a responsible adult take you home from the hospital or clinic. Arrange for someone to help you with activities during recovery. Plan to have a responsible adult care for you for the time you are told after you leave the hospital or clinic. This is important. What happens during the procedure?  An IV will be inserted into one of your veins. You will be given one or more of the following: A medicine to help you relax (sedative). A medicine that is injected into your spine to numb the area below and slightly above the injection site (spinal anesthetic). A medicine to make you fall asleep (general anesthetic). You will lie down on the operating table with your feet in stirrups. A small, thin tube (catheter) will be inserted through your urethra into your bladder to drain urine during surgery and recovery. An instrument (vaginal speculum) will be used to hold your vagina open. Your health care provider will perform the procedure according to the type of repair you require. To correct a cystocele: An incision will be made in the front wall of your vagina. Your bladder will be placed back into normal position. Weak or excess vaginal lining may be removed. The front wall of your vagina will be closed with stitches (sutures). To correct a rectocele or enterocele: An incision will be made in the back wall of your vagina. Your rectum or small intestine will be placed back into normal position. Weak or excess vaginal lining may be removed. The back wall of your vagina will be closed with sutures. To correct a procidentia: An incision will be made in the back and front walls of your vagina. Your uterus will be placed back into normal position. Weak or excess vaginal lining may be  removed. The front and back wall of your vagina will be closed with sutures. To place a sling: Small incisions may be made inside your vagina or in your lower abdominal or groin area to insert the sling. The sling will be placed around your urethra and attached to strong tissues inside your abdomen. These incisions may be closed with sutures or glue. Gauze packing will be placed inside your vagina. The procedure may vary among health care providers and hospitals. What happens after the procedure? Your blood pressure, heart rate, breathing rate, and blood oxygen level will be monitored until you leave the hospital or clinic. You will be given pain medicine as needed. You will be encouraged to eat and drink a regular diet. You will be encouraged to get up and walk as soon as you are able. You may need to wear compression stockings. These stockings help to prevent blood clots and reduce swelling in your legs. Your IV, urinary catheter, and vaginal packing may be removed before you go home. Summary Anterior and posterior colporrhaphy and sling procedure are combined surgical procedures that treat weakness in the anterior or posteriorwalls of the vagina. Plan to have a responsible adult take you home from the hospital or clinic. After the procedure, you will be encouraged to get up and walk as soon as you are able. The IV, urinary catheter, and vaginal packing may  be removed before you go home. This information is not intended to replace advice given to you by your health care provider. Make sure you discuss any questions you have with your health care provider. Document Revised: 10/29/2020 Document Reviewed: 10/10/2019 Elsevier Patient Education  [place].

## 2021-06-20 NOTE — H&P (View-Only) (Signed)
PRE-OPERATIVE HISTORY AND PHYSICAL EXAM  HPI:  Crystal Pearson is a 41 y.o. G2P0002 No LMP recorded. Patient has had a hysterectomy.; she is being admitted for surgery related to urinary incontinence.  She has long standing h/o stress incontinence (such as w trampoline, extreme exercise) but over the last few mos has noticed it is more frequently with any stress, such as coughing, laughter, sneezing, activity, gym exercise.  Has had to stop activities.  Goes thru pads and clothes frequently due to incontinence.  Usually occurs w stress, but can occur while sitting too.  No urgency or spasm.  No nocturia.  No fever.  No dysuria.  PMHx: Past Medical History:  Diagnosis Date   Chest pain    Endometriosis    Family history of heart disease    Scoliosis    Past Surgical History:  Procedure Laterality Date   ABDOMINAL HYSTERECTOMY     APPENDECTOMY     BACK SURGERY     Harrington Rod placement   CESAREAN SECTION     x2   endometrial surgery     FOOT SURGERY     KNEE SURGERY     OOPHORECTOMY     Family History  Problem Relation Age of Onset   Hypertension Mother    Cancer Father        bladder   Heart disease Father    Breast cancer Neg Hx    Social History   Tobacco Use   Smoking status: Never   Smokeless tobacco: Never  Vaping Use   Vaping Use: Never used  Substance Use Topics   Alcohol use: No   Drug use: No    Current Outpatient Medications:    escitalopram (LEXAPRO) 10 MG tablet, Take 10 mg by mouth daily., Disp: , Rfl:    naproxen (NAPROSYN) 500 MG tablet, Take 500 mg by mouth 2 (two) times daily as needed for pain., Disp: , Rfl:  Allergies: Patient has no known allergies.  Review of Systems  Constitutional:  Negative for chills, fever and malaise/fatigue.  HENT:  Negative for congestion, sinus pain and sore throat.   Eyes:  Negative for blurred vision and pain.  Respiratory:  Negative for cough and wheezing.   Cardiovascular:  Negative for chest pain and leg  swelling.  Gastrointestinal:  Negative for abdominal pain, constipation, diarrhea, heartburn, nausea and vomiting.  Genitourinary:  Negative for dysuria, frequency, hematuria and urgency.  Musculoskeletal:  Negative for back pain, joint pain, myalgias and neck pain.  Skin:  Negative for itching and rash.  Neurological:  Negative for dizziness, tremors and weakness.  Endo/Heme/Allergies:  Does not bruise/bleed easily.  Psychiatric/Behavioral:  Negative for depression. The patient is not nervous/anxious and does not have insomnia.    Objective: BP 128/82    Ht 5\' 5"  (1.651 m)    Wt 208 lb (94.3 kg)    BMI 34.61 kg/m   Filed Weights   06/20/21 1357  Weight: 208 lb (94.3 kg)   Physical Exam Constitutional:      General: She is not in acute distress.    Appearance: She is well-developed.  HENT:     Head: Normocephalic and atraumatic. No laceration.     Right Ear: Hearing normal.     Left Ear: Hearing normal.     Mouth/Throat:     Pharynx: Uvula midline.  Eyes:     Pupils: Pupils are equal, round, and reactive to light.  Neck:     Thyroid:  No thyromegaly.  Cardiovascular:     Rate and Rhythm: Normal rate and regular rhythm.     Heart sounds: No murmur heard.   No friction rub. No gallop.  Pulmonary:     Effort: Pulmonary effort is normal. No respiratory distress.     Breath sounds: Normal breath sounds. No wheezing.  Abdominal:     General: Bowel sounds are normal. There is no distension.     Palpations: Abdomen is soft.     Tenderness: There is no abdominal tenderness. There is no rebound.  Musculoskeletal:        General: Normal range of motion.     Cervical back: Normal range of motion and neck supple.  Neurological:     Mental Status: She is alert and oriented to person, place, and time.     Cranial Nerves: No cranial nerve deficit.  Skin:    General: Skin is warm and dry.  Psychiatric:        Judgment: Judgment normal.  Vitals reviewed.    Assessment: 1. Stress  incontinence   Plan Anterior colporrhaphy, TVT Pubovaginal sling, Cystoscopy Recovery and restrictions discussed Urinary retention and catheter use (20%) discussed  I have had a careful discussion with this patient about all the options available and the risk/benefits of each. I have fully informed this patient that surgery may subject her to a variety of discomforts and risks: She understands that most patients have surgery with little difficulty, but problems can happen ranging from minor to fatal. These include nausea, vomiting, pain, bleeding, infection, poor healing, hernia, or formation of adhesions. Unexpected reactions may occur from any drug or anesthetic given. Unintended injury may occur to other pelvic or abdominal structures such as Fallopian tubes, ovaries, bladder, ureter (tube from kidney to bladder), or bowel. Nerves going from the pelvis to the legs may be injured. Any such injury may require immediate or later additional surgery to correct the problem. Excessive blood loss requiring transfusion is very unlikely but possible. Dangerous blood clots may form in the legs or lungs. Physical and sexual activity will be restricted in varying degrees for an indeterminate period of time but most often 2-6 weeks.  Finally, she understands that it is impossible to list every possible undesirable effect and that the condition for which surgery is done is not always cured or significantly improved, and in rare cases may be even worse.Ample time was given to answer all questions.  Barnett Applebaum, MD, [facility], [facility] 06/20/2021  2:32 PM

## 2021-06-20 NOTE — Progress Notes (Signed)
PRE-OPERATIVE HISTORY AND PHYSICAL EXAM  HPI:  Crystal Pearson is a 41 y.o. G2P0002 No LMP recorded. Patient has had a hysterectomy.; she is being admitted for surgery related to urinary incontinence.  She has long standing h/o stress incontinence (such as w trampoline, extreme exercise) but over the last few mos has noticed it is more frequently with any stress, such as coughing, laughter, sneezing, activity, gym exercise.  Has had to stop activities.  Goes thru pads and clothes frequently due to incontinence.  Usually occurs w stress, but can occur while sitting too.  No urgency or spasm.  No nocturia.  No fever.  No dysuria.  PMHx: Past Medical History:  Diagnosis Date   Chest pain    Endometriosis    Family history of heart disease    Scoliosis    Past Surgical History:  Procedure Laterality Date   ABDOMINAL HYSTERECTOMY     APPENDECTOMY     BACK SURGERY     Harrington Rod placement   CESAREAN SECTION     x2   endometrial surgery     FOOT SURGERY     KNEE SURGERY     OOPHORECTOMY     Family History  Problem Relation Age of Onset   Hypertension Mother    Cancer Father        bladder   Heart disease Father    Breast cancer Neg Hx    Social History   Tobacco Use   Smoking status: Never   Smokeless tobacco: Never  Vaping Use   Vaping Use: Never used  Substance Use Topics   Alcohol use: No   Drug use: No    Current Outpatient Medications:    escitalopram (LEXAPRO) 10 MG tablet, Take 10 mg by mouth daily., Disp: , Rfl:    naproxen (NAPROSYN) 500 MG tablet, Take 500 mg by mouth 2 (two) times daily as needed for pain., Disp: , Rfl:  Allergies: Patient has no known allergies.  Review of Systems  Constitutional:  Negative for chills, fever and malaise/fatigue.  HENT:  Negative for congestion, sinus pain and sore throat.   Eyes:  Negative for blurred vision and pain.  Respiratory:  Negative for cough and wheezing.   Cardiovascular:  Negative for chest pain and leg  swelling.  Gastrointestinal:  Negative for abdominal pain, constipation, diarrhea, heartburn, nausea and vomiting.  Genitourinary:  Negative for dysuria, frequency, hematuria and urgency.  Musculoskeletal:  Negative for back pain, joint pain, myalgias and neck pain.  Skin:  Negative for itching and rash.  Neurological:  Negative for dizziness, tremors and weakness.  Endo/Heme/Allergies:  Does not bruise/bleed easily.  Psychiatric/Behavioral:  Negative for depression. The patient is not nervous/anxious and does not have insomnia.    Objective: BP 128/82    Ht 5\' 5"  (1.651 m)    Wt 208 lb (94.3 kg)    BMI 34.61 kg/m   Filed Weights   06/20/21 1357  Weight: 208 lb (94.3 kg)   Physical Exam Constitutional:      General: She is not in acute distress.    Appearance: She is well-developed.  HENT:     Head: Normocephalic and atraumatic. No laceration.     Right Ear: Hearing normal.     Left Ear: Hearing normal.     Mouth/Throat:     Pharynx: Uvula midline.  Eyes:     Pupils: Pupils are equal, round, and reactive to light.  Neck:     Thyroid:  No thyromegaly.  Cardiovascular:     Rate and Rhythm: Normal rate and regular rhythm.     Heart sounds: No murmur heard.   No friction rub. No gallop.  Pulmonary:     Effort: Pulmonary effort is normal. No respiratory distress.     Breath sounds: Normal breath sounds. No wheezing.  Abdominal:     General: Bowel sounds are normal. There is no distension.     Palpations: Abdomen is soft.     Tenderness: There is no abdominal tenderness. There is no rebound.  Musculoskeletal:        General: Normal range of motion.     Cervical back: Normal range of motion and neck supple.  Neurological:     Mental Status: She is alert and oriented to person, place, and time.     Cranial Nerves: No cranial nerve deficit.  Skin:    General: Skin is warm and dry.  Psychiatric:        Judgment: Judgment normal.  Vitals reviewed.    Assessment: 1. Stress  incontinence   Plan Anterior colporrhaphy, TVT Pubovaginal sling, Cystoscopy Recovery and restrictions discussed Urinary retention and catheter use (20%) discussed  I have had a careful discussion with this patient about all the options available and the risk/benefits of each. I have fully informed this patient that surgery may subject her to a variety of discomforts and risks: She understands that most patients have surgery with little difficulty, but problems can happen ranging from minor to fatal. These include nausea, vomiting, pain, bleeding, infection, poor healing, hernia, or formation of adhesions. Unexpected reactions may occur from any drug or anesthetic given. Unintended injury may occur to other pelvic or abdominal structures such as Fallopian tubes, ovaries, bladder, ureter (tube from kidney to bladder), or bowel. Nerves going from the pelvis to the legs may be injured. Any such injury may require immediate or later additional surgery to correct the problem. Excessive blood loss requiring transfusion is very unlikely but possible. Dangerous blood clots may form in the legs or lungs. Physical and sexual activity will be restricted in varying degrees for an indeterminate period of time but most often 2-6 weeks.  Finally, she understands that it is impossible to list every possible undesirable effect and that the condition for which surgery is done is not always cured or significantly improved, and in rare cases may be even worse.Ample time was given to answer all questions.  Barnett Applebaum, MD, [facility], [facility] 06/20/2021  2:32 PM

## 2021-06-23 ENCOUNTER — Other Ambulatory Visit
Admission: RE | Admit: 2021-06-23 | Discharge: 2021-06-23 | Disposition: A | Discharge status: Home / Self Care | Payer: BC Managed Care – PPO | Source: Ambulatory Visit | Attending: Obstetrics & Gynecology | Admitting: Obstetrics & Gynecology

## 2021-06-23 ENCOUNTER — Other Ambulatory Visit: Payer: Self-pay

## 2021-06-23 DIAGNOSIS — N393 Stress incontinence (female) (male): Secondary | ICD-10-CM

## 2021-06-23 DIAGNOSIS — N8110 Cystocele, unspecified: Secondary | ICD-10-CM | POA: Diagnosis not present

## 2021-06-23 DIAGNOSIS — M1990 Unspecified osteoarthritis, unspecified site: Secondary | ICD-10-CM | POA: Diagnosis not present

## 2021-06-23 DIAGNOSIS — Z90710 Acquired absence of both cervix and uterus: Secondary | ICD-10-CM | POA: Diagnosis not present

## 2021-06-23 DIAGNOSIS — Z87442 Personal history of urinary calculi: Secondary | ICD-10-CM | POA: Diagnosis not present

## 2021-06-23 DIAGNOSIS — Z538 Procedure and treatment not carried out for other reasons: Secondary | ICD-10-CM | POA: Diagnosis not present

## 2021-06-23 DIAGNOSIS — N9971 Accidental puncture and laceration of a genitourinary system organ or structure during a genitourinary system procedure: Secondary | ICD-10-CM | POA: Diagnosis not present

## 2021-06-23 HISTORY — DX: Family history of other specified conditions: Z84.89

## 2021-06-23 LAB — CBC
HCT: 39.2 % (ref 36.0–46.0)
Hemoglobin: 12.5 g/dL (ref 12.0–15.0)
MCH: 28 pg (ref 26.0–34.0)
MCHC: 31.9 g/dL (ref 30.0–36.0)
MCV: 87.9 fL (ref 80.0–100.0)
Platelets: 294 10*3/uL (ref 150–400)
RBC: 4.46 MIL/uL (ref 3.87–5.11)
RDW: 12.6 % (ref 11.5–15.5)
WBC: 9.5 10*3/uL (ref 4.0–10.5)
nRBC: 0 % (ref 0.0–0.2)

## 2021-06-23 LAB — TYPE AND SCREEN
ABO/RH(D): O NEG
Antibody Screen: NEGATIVE

## 2021-06-23 NOTE — Patient Instructions (Addendum)
Your procedure is scheduled on: Thursday June 26, 2021. Report to Day Surgery inside [facility] 2nd floor, stop by admissions desk before getting on elevator.  To find out your arrival time please call [phone] between 1PM - 3PM on Wednesday June 25, 2021.  Remember: Instructions that are not followed completely may result in serious medical risk,  up to and including death, or upon the discretion of your surgeon and anesthesiologist your  surgery may need to be rescheduled.     _X__ 1. Do not eat food or drink fluids after midnight the night before your procedure.                 No chewing gum or hard candies.   __X__2.  On the morning of surgery brush your teeth with toothpaste and water, you                may rinse your mouth with mouthwash if you wish.  Do not swallow any toothpaste or mouthwash.     _X__ 3.  No Alcohol for 24 hours before or after surgery.   _X__ 4.  Do Not Smoke or use e-cigarettes For 24 Hours Prior to Your Surgery.                 Do not use any chewable tobacco products for at least 6 hours prior to                 Surgery.  _X__  5.  Do not use any recreational drugs (marijuana, cocaine, heroin, ecstasy, MDMA or other)                For at least one week prior to your surgery.  Combination of these drugs with anesthesia                May have life threatening results.  ____  6.  Bring all medications with you on the day of surgery if instructed.   __X__  7.  Notify your doctor if there is any change in your medical condition      (cold, fever, infections).     Do not wear jewelry, make-up, hairpins, clips or nail polish. Do not wear lotions, powders, or perfumes. You may wear deodorant. Do not shave 48 hours prior to surgery. Men may shave face and neck. Do not bring valuables to the hospital.    [facility] is not responsible for any belongings or valuables.  Contacts, dentures or bridgework may not be worn into  surgery. Leave your suitcase in the car. After surgery it may be brought to your room. For patients admitted to the hospital, discharge time is determined by your treatment team.   Patients discharged the day of surgery will not be allowed to drive home.   Make arrangements for someone to be with you for the first 24 hours of your Same Day Discharge.   __X__ Take these medicines the morning of surgery with A SIP OF WATER:    1. None   2.   3.   4.  5.  6.  ____ Fleet Enema (as directed)   ____ Use CHG Soap (or wipes) as directed  ____ Use Benzoyl Peroxide Gel as instructed  ____ Use inhalers on the day of surgery  ____ Stop metformin 2 days prior to surgery    ____ Take 1/2 of usual insulin dose the night before surgery. No insulin the morning  of surgery.   ____ Call your PCP, cardiologist, or Pulmonologist if taking Coumadin/Plavix/aspirin and ask when to stop before your surgery.   __X__ One Week prior to surgery- Stop Anti-inflammatories such as Ibuprofen, Aleve, Advil, Motrin, meloxicam (MOBIC), diclofenac, etodolac, ketorolac, Toradol, Daypro, piroxicam, Goody's or BC powders. OK TO USE TYLENOL IF NEEDED   __X__ Stop supplements until after surgery.    ____ Bring C-Pap to the hospital.    If you have any questions regarding your pre-procedure instructions,  Please call Pre-admit Testing at [phone]

## 2021-06-25 MED ORDER — POVIDONE-IODINE 10 % EX SWAB
2.0000 | Freq: Once | CUTANEOUS | Status: AC
Start: 2021-06-25 — End: 2021-06-26
  Administered 2021-06-26: 2 via TOPICAL

## 2021-06-25 MED ORDER — ORAL CARE MOUTH RINSE: 15.0000 mL | Freq: Once | OROMUCOSAL | Status: AC

## 2021-06-25 MED ORDER — SODIUM CHLORIDE 0.9 % IV SOLN
2.0000 g | INTRAVENOUS | Status: AC
  Administered 2021-06-26: 2 g via INTRAVENOUS
  Filled 2021-06-25: qty 2

## 2021-06-25 MED ORDER — LACTATED RINGERS IV SOLN
INTRAVENOUS | Status: DC
Start: 2021-06-25 — End: 2021-06-26

## 2021-06-25 MED ORDER — LACTATED RINGERS IV SOLN: INTRAVENOUS | Status: DC

## 2021-06-25 MED ORDER — CHLORHEXIDINE GLUCONATE 0.12 % MT SOLN: 15.0000 mL | Freq: Once | OROMUCOSAL | Status: AC

## 2021-06-25 MED ORDER — FAMOTIDINE 20 MG PO TABS: 20.0000 mg | ORAL_TABLET | Freq: Once | ORAL | Status: AC

## 2021-06-26 ENCOUNTER — Encounter: Payer: Self-pay | Admitting: Obstetrics & Gynecology

## 2021-06-26 ENCOUNTER — Other Ambulatory Visit: Payer: Self-pay

## 2021-06-26 ENCOUNTER — Encounter
Admission: RE | Disposition: A | Discharge status: Home / Self Care | Payer: Self-pay | Source: Home / Self Care | Attending: Obstetrics & Gynecology

## 2021-06-26 ENCOUNTER — Ambulatory Visit: Payer: BC Managed Care – PPO | Admitting: Anesthesiology

## 2021-06-26 ENCOUNTER — Ambulatory Visit
Admission: RE | Admit: 2021-06-26 | Discharge: 2021-06-26 | Disposition: A | Discharge status: Home / Self Care | Payer: BC Managed Care – PPO | Attending: Obstetrics & Gynecology | Admitting: Obstetrics & Gynecology

## 2021-06-26 DIAGNOSIS — N393 Stress incontinence (female) (male): Secondary | ICD-10-CM | POA: Diagnosis not present

## 2021-06-26 DIAGNOSIS — Z87442 Personal history of urinary calculi: Secondary | ICD-10-CM | POA: Insufficient documentation

## 2021-06-26 DIAGNOSIS — Z90710 Acquired absence of both cervix and uterus: Secondary | ICD-10-CM | POA: Insufficient documentation

## 2021-06-26 DIAGNOSIS — N9971 Accidental puncture and laceration of a genitourinary system organ or structure during a genitourinary system procedure: Secondary | ICD-10-CM | POA: Insufficient documentation

## 2021-06-26 DIAGNOSIS — Z538 Procedure and treatment not carried out for other reasons: Secondary | ICD-10-CM | POA: Insufficient documentation

## 2021-06-26 DIAGNOSIS — M1990 Unspecified osteoarthritis, unspecified site: Secondary | ICD-10-CM | POA: Insufficient documentation

## 2021-06-26 DIAGNOSIS — N8110 Cystocele, unspecified: Secondary | ICD-10-CM | POA: Insufficient documentation

## 2021-06-26 DIAGNOSIS — N8111 Cystocele, midline: Secondary | ICD-10-CM | POA: Diagnosis present

## 2021-06-26 HISTORY — PX: CYSTOSCOPY: SHX5120

## 2021-06-26 HISTORY — PX: CYSTOCELE REPAIR: SHX163

## 2021-06-26 LAB — ABO/RH: ABO/RH(D): O NEG

## 2021-06-26 SURGERY — COLPORRHAPHY, ANTERIOR, FOR CYSTOCELE REPAIR
Anesthesia: General

## 2021-06-26 MED ORDER — ACETAMINOPHEN 650 MG RE SUPP
650.0000 mg | RECTAL | Status: DC | PRN
  Filled 2021-06-26: qty 1

## 2021-06-26 MED ORDER — PHENYLEPHRINE 40 MCG/ML (10ML) SYRINGE FOR IV PUSH (FOR BLOOD PRESSURE SUPPORT)
PREFILLED_SYRINGE | INTRAVENOUS | Status: DC | PRN
  Administered 2021-06-26 (×2): 80 ug via INTRAVENOUS
  Administered 2021-06-26: 160 ug via INTRAVENOUS
  Administered 2021-06-26: 80 ug via INTRAVENOUS

## 2021-06-26 MED ORDER — CEPHALEXIN 500 MG PO CAPS: 500.0000 mg | ORAL_CAPSULE | Freq: Two times a day (BID) | ORAL | 1 refills | Status: DC

## 2021-06-26 MED ORDER — BACITRACIN ZINC 500 UNIT/GM EX OINT
TOPICAL_OINTMENT | CUTANEOUS | Status: DC | PRN
  Administered 2021-06-26: 1 via TOPICAL

## 2021-06-26 MED ORDER — LIDOCAINE-EPINEPHRINE 1 %-1:100000 IJ SOLN
INTRAMUSCULAR | Status: AC
  Filled 2021-06-26: qty 2

## 2021-06-26 MED ORDER — SUGAMMADEX SODIUM 200 MG/2ML IV SOLN
INTRAVENOUS | Status: DC | PRN
  Administered 2021-06-26: 200 mg via INTRAVENOUS

## 2021-06-26 MED ORDER — FAMOTIDINE 20 MG PO TABS
ORAL_TABLET | ORAL | Status: AC
  Administered 2021-06-26: 20 mg via ORAL
  Filled 2021-06-26: qty 1

## 2021-06-26 MED ORDER — FENTANYL CITRATE (PF) 100 MCG/2ML IJ SOLN
INTRAMUSCULAR | Status: AC
  Filled 2021-06-26: qty 2

## 2021-06-26 MED ORDER — ACETAMINOPHEN 10 MG/ML IV SOLN
INTRAVENOUS | Status: DC | PRN
Start: 2021-06-26 — End: 2021-06-26
  Administered 2021-06-26: 1000 mg via INTRAVENOUS

## 2021-06-26 MED ORDER — MIDAZOLAM HCL 2 MG/2ML IJ SOLN
INTRAMUSCULAR | Status: DC | PRN
  Administered 2021-06-26 (×2): 1 mg via INTRAVENOUS

## 2021-06-26 MED ORDER — ONDANSETRON HCL 4 MG/2ML IJ SOLN
INTRAMUSCULAR | Status: DC | PRN
  Administered 2021-06-26: 4 mg via INTRAVENOUS

## 2021-06-26 MED ORDER — OXYCODONE HCL 5 MG/5ML PO SOLN: 5.0000 mg | Freq: Once | ORAL | Status: AC | PRN

## 2021-06-26 MED ORDER — ACETAMINOPHEN 325 MG PO TABS: 650.0000 mg | ORAL_TABLET | ORAL | Status: DC | PRN

## 2021-06-26 MED ORDER — MORPHINE SULFATE (PF) 2 MG/ML IV SOLN: 1.0000 mg | INTRAVENOUS | Status: DC | PRN

## 2021-06-26 MED ORDER — LIDOCAINE-EPINEPHRINE 1 %-1:100000 IJ SOLN
INTRAMUSCULAR | Status: DC | PRN
  Administered 2021-06-26: 10 mL

## 2021-06-26 MED ORDER — KETOROLAC TROMETHAMINE 30 MG/ML IJ SOLN
INTRAMUSCULAR | Status: DC | PRN
  Administered 2021-06-26: 30 mg via INTRAVENOUS

## 2021-06-26 MED ORDER — ROCURONIUM BROMIDE 10 MG/ML (PF) SYRINGE
PREFILLED_SYRINGE | INTRAVENOUS | Status: AC
  Filled 2021-06-26: qty 10

## 2021-06-26 MED ORDER — OXYCODONE-ACETAMINOPHEN 5-325 MG PO TABS: 1.0000 | ORAL_TABLET | ORAL | 0 refills | Status: DC | PRN

## 2021-06-26 MED ORDER — MIDAZOLAM HCL 2 MG/2ML IJ SOLN
INTRAMUSCULAR | Status: AC
  Filled 2021-06-26: qty 2

## 2021-06-26 MED ORDER — FENTANYL CITRATE (PF) 100 MCG/2ML IJ SOLN
25.0000 ug | INTRAMUSCULAR | Status: DC | PRN
  Administered 2021-06-26 (×2): 25 ug via INTRAVENOUS

## 2021-06-26 MED ORDER — BACITRACIN ZINC 500 UNIT/GM EX OINT
TOPICAL_OINTMENT | CUTANEOUS | Status: AC
  Filled 2021-06-26: qty 28.35

## 2021-06-26 MED ORDER — DEXAMETHASONE SODIUM PHOSPHATE 10 MG/ML IJ SOLN
INTRAMUSCULAR | Status: AC
Start: 2021-06-26 — End: ?
  Filled 2021-06-26: qty 1

## 2021-06-26 MED ORDER — LIDOCAINE HCL (PF) 2 % IJ SOLN
INTRAMUSCULAR | Status: AC
  Filled 2021-06-26: qty 5

## 2021-06-26 MED ORDER — ACETAMINOPHEN 10 MG/ML IV SOLN
INTRAVENOUS | Status: AC
  Filled 2021-06-26: qty 100

## 2021-06-26 MED ORDER — OXYCODONE HCL 5 MG PO TABS
ORAL_TABLET | ORAL | Status: AC
  Filled 2021-06-26: qty 1

## 2021-06-26 MED ORDER — LACTATED RINGERS IV SOLN: INTRAVENOUS | Status: DC

## 2021-06-26 MED ORDER — CHLORHEXIDINE GLUCONATE 0.12 % MT SOLN
OROMUCOSAL | Status: AC
  Administered 2021-06-26: 15 mL via OROMUCOSAL
  Filled 2021-06-26: qty 15

## 2021-06-26 MED ORDER — CEPHALEXIN 500 MG PO CAPS: 500.0000 mg | ORAL_CAPSULE | Freq: Two times a day (BID) | ORAL | Status: DC

## 2021-06-26 MED ORDER — DEXAMETHASONE SODIUM PHOSPHATE 10 MG/ML IJ SOLN
INTRAMUSCULAR | Status: DC | PRN
  Administered 2021-06-26: 10 mg via INTRAVENOUS

## 2021-06-26 MED ORDER — 0.9 % SODIUM CHLORIDE (POUR BTL) OPTIME
TOPICAL | Status: DC | PRN
Start: 2021-06-26 — End: 2021-06-26
  Administered 2021-06-26: 500 mL

## 2021-06-26 MED ORDER — FENTANYL CITRATE (PF) 100 MCG/2ML IJ SOLN
INTRAMUSCULAR | Status: DC | PRN
  Administered 2021-06-26: 25 ug via INTRAVENOUS
  Administered 2021-06-26: 50 ug via INTRAVENOUS
  Administered 2021-06-26: 25 ug via INTRAVENOUS

## 2021-06-26 MED ORDER — FENTANYL CITRATE (PF) 100 MCG/2ML IJ SOLN
INTRAMUSCULAR | Status: AC
  Administered 2021-06-26: 25 ug via INTRAVENOUS
  Filled 2021-06-26: qty 2

## 2021-06-26 MED ORDER — PROPOFOL 10 MG/ML IV BOLUS
INTRAVENOUS | Status: DC | PRN
  Administered 2021-06-26: 160 mg via INTRAVENOUS

## 2021-06-26 MED ORDER — ONDANSETRON HCL 4 MG/2ML IJ SOLN
INTRAMUSCULAR | Status: AC
  Filled 2021-06-26: qty 2

## 2021-06-26 MED ORDER — OXYCODONE-ACETAMINOPHEN 5-325 MG PO TABS: 1.0000 | ORAL_TABLET | ORAL | Status: DC | PRN

## 2021-06-26 MED ORDER — LIDOCAINE HCL (CARDIAC) PF 100 MG/5ML IV SOSY
PREFILLED_SYRINGE | INTRAVENOUS | Status: DC | PRN
  Administered 2021-06-26: 80 mg via INTRAVENOUS

## 2021-06-26 MED ORDER — OXYCODONE HCL 5 MG PO TABS
5.0000 mg | ORAL_TABLET | Freq: Once | ORAL | Status: AC | PRN
  Administered 2021-06-26: 5 mg via ORAL

## 2021-06-26 MED ORDER — PROPOFOL 10 MG/ML IV BOLUS
INTRAVENOUS | Status: AC
  Filled 2021-06-26: qty 20

## 2021-06-26 MED ORDER — ROCURONIUM BROMIDE 100 MG/10ML IV SOLN
INTRAVENOUS | Status: DC | PRN
Start: 2021-06-26 — End: 2021-06-26
  Administered 2021-06-26: 40 mg via INTRAVENOUS
  Administered 2021-06-26: 5 mg via INTRAVENOUS

## 2021-06-26 SURGICAL SUPPLY — 57 items
ADH SKN CLS APL DERMABOND .7 (GAUZE/BANDAGES/DRESSINGS) ×1
BACTOSHIELD CHG 4% 4OZ (MISCELLANEOUS) ×1
BAG DRN RND TRDRP ANRFLXCHMBR (UROLOGICAL SUPPLIES) ×1
BAG URINE DRAIN 2000ML AR STRL (UROLOGICAL SUPPLIES) ×2 IMPLANT
BLADE SURG 15 STRL LF DISP TIS (BLADE) ×1 IMPLANT
BLADE SURG 15 STRL SS (BLADE) ×2
BLADE SURG SZ10 CARB STEEL (BLADE) ×2 IMPLANT
CATH FOLEY 2WAY  5CC 16FR (CATHETERS) ×2
CATH FOLEY 2WAY 5CC 16FR (CATHETERS) ×1
CATH URTH 16FR FL 2W BLN LF (CATHETERS) ×1 IMPLANT
DERMABOND ADVANCED (GAUZE/BANDAGES/DRESSINGS) ×1
DERMABOND ADVANCED .7 DNX12 (GAUZE/BANDAGES/DRESSINGS) IMPLANT
DRAPE 3/4 80X56 (DRAPES) ×2 IMPLANT
DRAPE PERI LITHO V/GYN (MISCELLANEOUS) ×2 IMPLANT
DRAPE UNDER BUTTOCK W/FLU (DRAPES) ×2 IMPLANT
ELECT REM PT RETURN 9FT ADLT (ELECTROSURGICAL) ×2
ELECTRODE REM PT RTRN 9FT ADLT (ELECTROSURGICAL) ×1 IMPLANT
GAUZE 4X4 16PLY ~~LOC~~+RFID DBL (SPONGE) ×4 IMPLANT
GAUZE PACK 2X3YD (PACKING) ×2 IMPLANT
GAUZE PACKING 1/2X5YD (GAUZE/BANDAGES/DRESSINGS) ×1 IMPLANT
GLOVE SURG ENC MOIS LTX SZ8 (GLOVE) ×4 IMPLANT
GOWN STRL REUS W/ TWL LRG LVL3 (GOWN DISPOSABLE) ×3 IMPLANT
GOWN STRL REUS W/ TWL XL LVL3 (GOWN DISPOSABLE) ×1 IMPLANT
GOWN STRL REUS W/TWL LRG LVL3 (GOWN DISPOSABLE) ×6
GOWN STRL REUS W/TWL XL LVL3 (GOWN DISPOSABLE) ×2
KIT TURNOVER CYSTO (KITS) ×2 IMPLANT
KIT TURNOVER KIT A (KITS) ×2 IMPLANT
LABEL OR SOLS (LABEL) ×2 IMPLANT
MANIFOLD NEPTUNE II (INSTRUMENTS) ×2 IMPLANT
NDL HPO THNWL 1X22GA REG BVL (NEEDLE) ×1 IMPLANT
NDL SPNL 22GX3.5 QUINCKE BK (NEEDLE) ×1 IMPLANT
NEEDLE SAFETY 22GX1 (NEEDLE) ×2
NEEDLE SPNL 22GX3.5 QUINCKE BK (NEEDLE) ×2 IMPLANT
NS IRRIG 500ML POUR BTL (IV SOLUTION) ×2 IMPLANT
PACK BASIN MINOR ARMC (MISCELLANEOUS) ×2 IMPLANT
PAD OB MATERNITY 4.3X12.25 (PERSONAL CARE ITEMS) ×2 IMPLANT
PAD PREP 24X41 OB/GYN DISP (PERSONAL CARE ITEMS) ×2 IMPLANT
RETRACTOR PHONTONGUIDE ADAPT (ADAPTER) IMPLANT
SCRUB CHG 4% DYNA-HEX 4OZ (MISCELLANEOUS) ×1 IMPLANT
SET CYSTO W/LG BORE CLAMP LF (SET/KITS/TRAYS/PACK) ×2 IMPLANT
SLING SUPRIS RETROPUBIC KIT (Miscellaneous) ×1 IMPLANT
SLING TVT EXACT (Sling) ×1 IMPLANT
SOL PREP PVP 2OZ (MISCELLANEOUS) ×2
SOLUTION PREP PVP 2OZ (MISCELLANEOUS) ×1 IMPLANT
SURGILUBE 2OZ TUBE FLIPTOP (MISCELLANEOUS) ×2 IMPLANT
SUT ETHIBOND NAB CT1 #1 30IN (SUTURE) ×8 IMPLANT
SUT VIC AB 0 CT1 27 (SUTURE) ×2
SUT VIC AB 0 CT1 27XCR 8 STRN (SUTURE) ×1 IMPLANT
SUT VIC AB 2-0 CT1 (SUTURE) ×6 IMPLANT
SUT VIC AB 2-0 CT1 27 (SUTURE) ×2
SUT VIC AB 2-0 CT1 TAPERPNT 27 (SUTURE) ×1 IMPLANT
SUT VIC AB 3-0 SH 27 (SUTURE) ×2
SUT VIC AB 3-0 SH 27X BRD (SUTURE) ×1 IMPLANT
SYR 10ML LL (SYRINGE) ×2 IMPLANT
SYR BULB IRRIG 60ML STRL (SYRINGE) ×2 IMPLANT
SYR CONTROL 10ML LL (SYRINGE) ×2 IMPLANT
WATER STERILE IRR 500ML POUR (IV SOLUTION) ×2 IMPLANT

## 2021-06-26 NOTE — Transfer of Care (Signed)
Immediate Anesthesia Transfer of Care Note ? ?Patient: Crystal Pearson ? ?Procedure(s) Performed: ANTERIOR REPAIR (CYSTOCELE) ?CYSTOSCOPY ? ?Patient Location: PACU ? ?Anesthesia Type:General ? ?Level of Consciousness: awake ? ?Airway & Oxygen Therapy: Patient Spontanous Breathing and Patient connected to face mask oxygen ? ?Post-op Assessment: Report given to RN and Post -op Vital signs reviewed and stable ? ?Post vital signs: Reviewed and stable ? ?Last Vitals:  ?Vitals Value Taken Time  ?BP 129/74 06/26/21 1145  ?Temp    ?Pulse 103 06/26/21 1147  ?Resp 18 06/26/21 1147  ?SpO2 100 % 06/26/21 1147  ?Vitals shown include unvalidated device data. ? ?Last Pain:  ?Vitals:  ? 06/26/21 1142  ?TempSrc:   ?PainSc: Asleep  ?   ? ?  ? ?Complications: No notable events documented. ?

## 2021-06-26 NOTE — Discharge Instructions (Addendum)
AMBULATORY SURGERY  ?DISCHARGE INSTRUCTIONS ? ? ?The drugs that you were given will stay in your system until tomorrow so for the next 24 hours you should not: ? ?Drive an automobile ?Make any legal decisions ?Drink any alcoholic beverage ? ? ?You may resume regular meals tomorrow.  Today it is better to start with liquids and gradually work up to solid foods. ? ?You may eat anything you prefer, but it is better to start with liquids, then soup and crackers, and gradually work up to solid foods. ? ? ?Please notify your doctor immediately if you have any unusual bleeding, trouble breathing, redness and pain at the surgery site, drainage, fever, or pain not relieved by medication. ?  ? ? ? ?Additional Instructions:  ? ?AMBULATORY SURGERY  ?DISCHARGE INSTRUCTIONS ? ? ?The drugs that you were given will stay in your system until tomorrow so for the next 24 hours you should not: ? ?Drive an automobile ?Make any legal decisions ?Drink any alcoholic beverage ? ? ?You may resume regular meals tomorrow.  Today it is better to start with liquids and gradually work up to solid foods. ? ?You may eat anything you prefer, but it is better to start with liquids, then soup and crackers, and gradually work up to solid foods. ? ? ?Please notify your doctor immediately if you have any unusual bleeding, trouble breathing, redness and pain at the surgery site, drainage, fever, or pain not relieved by medication. ? ? ? ?Additional Instructions: ? ? ? ? ? ? ? ?Please contact your physician with any problems or Same Day Surgery at [phone], Monday through Friday 6 am to 4 pm, or [place] at [facility] number at [phone].  ?

## 2021-06-26 NOTE — Anesthesia Procedure Notes (Signed)
Procedure Name: Intubation ?Date/Time: 06/26/2021 10:16 AM ?Performed by: Loletha Grayer, CRNA ?Pre-anesthesia Checklist: Patient identified, Patient being monitored, Timeout performed, Emergency Drugs available and Suction available ?Patient Re-evaluated:Patient Re-evaluated prior to induction ?Oxygen Delivery Method: Circle system utilized ?Preoxygenation: Pre-oxygenation with 100% oxygen ?Induction Type: IV induction ?Ventilation: Mask ventilation without difficulty and Oral airway inserted - appropriate to patient size ?Laryngoscope Size: 3 and McGraph ?Grade View: Grade I ?Tube type: Oral ?Tube size: 7.0 mm ?Number of attempts: 1 ?Airway Equipment and Method: Stylet ?Placement Confirmation: ETT inserted through vocal cords under direct vision, positive ETCO2 and breath sounds checked- equal and bilateral ?Secured at: 21 cm ?Tube secured with: Tape ?Dental Injury: Teeth and Oropharynx as per pre-operative assessment  ? ? ? ? ?

## 2021-06-26 NOTE — Progress Notes (Signed)
Vaginal packing removed

## 2021-06-26 NOTE — Anesthesia Postprocedure Evaluation (Signed)
Anesthesia Post Note ? ?Patient: Crystal Pearson ? ?Procedure(s) Performed: ANTERIOR REPAIR (CYSTOCELE) ?CYSTOSCOPY ? ?Patient location during evaluation: PACU ?Anesthesia Type: General ?Level of consciousness: awake and alert ?Pain management: pain level controlled ?Vital Signs Assessment: post-procedure vital signs reviewed and stable ?Respiratory status: spontaneous breathing, nonlabored ventilation, respiratory function stable and patient connected to nasal cannula oxygen ?Cardiovascular status: blood pressure returned to baseline and stable ?Postop Assessment: no apparent nausea or vomiting ?Anesthetic complications: no ? ? ?No notable events documented. ? ? ?Last Vitals:  ?Vitals:  ? 06/26/21 1245 06/26/21 1313  ?BP: 98/87 124/82  ?Pulse: 81 84  ?Resp: 14 15  ?Temp:  (!) 36.2 ?C  ?SpO2: 95% 94%  ?  ?Last Pain:  ?Vitals:  ? 06/26/21 1313  ?TempSrc: Temporal  ?PainSc: 6   ? ? ?  ?  ?  ?  ?  ?  ? ?Precious Haws Earley Grobe ? ? ? ? ?

## 2021-06-26 NOTE — Op Note (Signed)
Operative Note: ? ?PRE-OP DIAGNOSIS: Stress Urinary Incontinence  ? ?POST-OP DIAGNOSIS: Stress Urinary Incontinence  ? ?PROCEDURE: Procedure(s): ?ANTERIOR REPAIR (CYSTOCELE) ?CYSTOSCOPY ? ?SURGEON: Barnett Applebaum, MD, FACOG ? ?ASSISTANT: Lawernce Pitts, RNFA ? ?ANESTHESIA: General endotracheal anesthesia ? ?ESTIMATED BLOOD LOSS: less than 50  ? ?SPECIMENS: None. ? ?COMPLICATIONS: None ? ?DISPOSITION: stable to PACU ? ?FINDINGS: Cystocele ? ?PROCEDURE:   Patient was taken to the OR where she was placed in dorsal lithotomy in [place]. She was prepped and draped in the usual sterile fashion. A timeout was performed. Foley is placed into bladder. A speculum was placed inside the vagina. Above findings noted. ?  ?Anterior colporrhaphy is performed.  Allis clamps are placed along the anterior vaginal wall, lidocaine is used to infiltrate the plane, and incision is made midline vertical.  Endopelvic fascia is dissected free of vaginal mucosa.   ? ?A TVT pubovaginal sling procedure is planned and started.  The retropubic space is dissected by blunt and sharp dissection.  The TVT Exact Sling w guides are then placed while bladder is deflated and deviated to contralateral sides for each placement.  Cystoscopy is performed, and noted right sided small perforation of bladder.  TVT guides and sling is thus removed and not placed.  It is not further attempted, and foley catheter is placed with plans for post op decompression of bladder for a few days.  Clear blood-tinged urine noted. ? ?Endopelvic Fascia is then plicated w interrupted vicryl sutures.  Excess mucosa is excised.  Vaginal incision is closed with a running locking Vicryl suture.  Excellent hemostasis was noted at the end of the case.  ? ?The small exit incisions in the mons pubis are closed with Dermabond. ? ?Packing gauze w Bacitracin cream is placed. A Foley catheter is left in  place inside her bladder. Clear, yellow urine was noted. All instrument needle  and lap counts were correct x 2. Patient was awakened taken to recovery room in stable condition. ? ?Barnett Applebaum, MD, [place] ?[facility], [place] ?06/26/2021  11:38 AM ? ? ?

## 2021-06-26 NOTE — Interval H&P Note (Signed)
History and Physical Interval Note: ? ?06/26/2021 ?9:37 AM ? ?Crystal Pearson  has presented today for surgery, with the diagnosis of Stress Urinary Incontinence.  The various methods of treatment have been discussed with the patient and family. After consideration of risks, benefits and other options for treatment, the patient has consented to  Procedure(s): ?ANTERIOR REPAIR (CYSTOCELE) (N/A) ?TRANSVAGINAL TAPE (TVT) PROCEDURE (N/A) ?CYSTOSCOPY (N/A) as a surgical intervention.  The patient's history has been reviewed, patient examined, no change in status, stable for surgery.  I have reviewed the patient's chart and labs.  Questions were answered to the patient's satisfaction.   ? ? ?Hoyt Koch ? ? ?

## 2021-06-26 NOTE — Anesthesia Preprocedure Evaluation (Signed)
Anesthesia Evaluation  ?Patient identified by MRN, date of birth, ID band ?Patient awake ? ? ? ?Reviewed: ?Allergy & Precautions, NPO status , Patient's Chart, lab work & pertinent test results ? ?History of Anesthesia Complications ?(+) Family history of anesthesia reaction and history of anesthetic complications ? ?Airway ?Mallampati: III ? ?TM Distance: <3 FB ?Neck ROM: full ? ? ? Dental ? ?(+) Chipped ?  ?Pulmonary ?neg pulmonary ROS, neg shortness of breath,  ?  ?Pulmonary exam normal ? ? ? ? ? ? ? Cardiovascular ?Exercise Tolerance: Good ?(-) angina(-) Past MI negative cardio ROS ?Normal cardiovascular exam ? ? ?  ?Neuro/Psych ?negative neurological ROS ? negative psych ROS  ? GI/Hepatic ?negative GI ROS, Neg liver ROS, neg GERD  ,  ?Endo/Other  ?negative endocrine ROS ? Renal/GU ?  ? ?  ?Musculoskeletal ? ?(+) Arthritis ,  ? Abdominal ?  ?Peds ? Hematology ?negative hematology ROS ?(+)   ?Anesthesia Other Findings ?Past Medical History: ?No date: Chest pain ?No date: Endometriosis ?No date: Family history of adverse reaction to anesthesia ?No date: Family history of heart disease ?01/2021: History of kidney stones ?No date: Scoliosis ? ?Past Surgical History: ?No date: ABDOMINAL HYSTERECTOMY ?No date: APPENDECTOMY ?No date: BACK SURGERY ?    Comment:  Harrington Rod placement ?No date: CESAREAN SECTION ?    Comment:  x2 ?No date: endometrial surgery ?No date: FOOT SURGERY ?No date: KNEE SURGERY ?No date: OOPHORECTOMY ? ?BMI   ? Body Mass Index: 34.45 kg/m?  ?  ? ? Reproductive/Obstetrics ?negative OB ROS ? ?  ? ? ? ? ? ? ? ? ? ? ? ? ? ?  ?  ? ? ? ? ? ? ? ? ?Anesthesia Physical ?Anesthesia Plan ? ?ASA: 2 ? ?Anesthesia Plan: General ETT  ? ?Post-op Pain Management:   ? ?Induction: Intravenous ? ?PONV Risk Score and Plan: Ondansetron, Dexamethasone, Midazolam and Treatment may vary due to age or medical condition ? ?Airway Management Planned: Oral ETT ? ?Additional Equipment:   ? ?Intra-op Plan:  ? ?Post-operative Plan: Extubation in OR ? ?Informed Consent: I have reviewed the patients History and Physical, chart, labs and discussed the procedure including the risks, benefits and alternatives for the proposed anesthesia with the patient or authorized representative who has indicated his/her understanding and acceptance.  ? ? ? ?Dental Advisory Given ? ?Plan Discussed with: Anesthesiologist, CRNA and Surgeon ? ?Anesthesia Plan Comments: (Patient consented for risks of anesthesia including but not limited to:  ?- adverse reactions to medications ?- damage to eyes, teeth, lips or other oral mucosa ?- nerve damage due to positioning  ?- sore throat or hoarseness ?- Damage to heart, brain, nerves, lungs, other parts of body or loss of life ? ?Patient voiced understanding.)  ? ? ? ? ? ? ?Anesthesia Quick Evaluation ? ?

## 2021-06-27 ENCOUNTER — Encounter: Payer: Self-pay | Admitting: Obstetrics & Gynecology

## 2021-07-01 ENCOUNTER — Other Ambulatory Visit: Payer: Self-pay | Admitting: Obstetrics & Gynecology

## 2021-07-01 ENCOUNTER — Telehealth: Payer: Self-pay

## 2021-07-01 MED ORDER — TRAMADOL HCL 50 MG PO TABS
50.0000 mg | ORAL_TABLET | Freq: Four times a day (QID) | ORAL | 0 refills | Status: DC | PRN
Start: 2021-07-01 — End: 2021-07-06

## 2021-07-01 NOTE — Telephone Encounter (Signed)
Left message to advise pt of [facility] message ?

## 2021-07-01 NOTE — Telephone Encounter (Signed)
Pt needs a different pain med's, these pain med are a little strong they knock her out and make her itch.

## 2021-07-01 NOTE — Telephone Encounter (Signed)
Patient is calling to speak with Dr. Kenton Kingfisher nurse. Patient is requesting to see if she can prolong to date of return to work for Monday, 07/07/21 Patient also states she is having a little pain. Patient is aware she is scheduled for 07/02/21 for Cathter removal.

## 2021-07-01 NOTE — Telephone Encounter (Signed)
Tramadol Rx called in.  Keep appt tomorrow.  OK for out of work extension.

## 2021-07-02 ENCOUNTER — Ambulatory Visit (INDEPENDENT_AMBULATORY_CARE_PROVIDER_SITE_OTHER): Payer: BC Managed Care – PPO | Admitting: Obstetrics & Gynecology

## 2021-07-02 ENCOUNTER — Other Ambulatory Visit: Payer: Self-pay

## 2021-07-02 ENCOUNTER — Encounter: Payer: Self-pay | Admitting: Obstetrics & Gynecology

## 2021-07-02 VITALS — BP 120/80 | Ht 65.0 in | Wt 208.0 lb

## 2021-07-02 DIAGNOSIS — Z9889 Other specified postprocedural states: Secondary | ICD-10-CM

## 2021-07-02 NOTE — Progress Notes (Signed)
?  Postoperative Follow-up ?Patient presents post op from anterior colporrhaphy for pelvic relaxation and urinary incontinence, 1 week ago.  She also was to have TVT sling, but due to bladder perforation, this was not performed. Pt has foley catheter since that time. ? ?Subjective: ?Patient reports irritation w foley catheter and some right sided pains.  Urine clear, not bloody.  Eating a regular diet without difficulty. Pain is controlled with current analgesics. Medications being used: narcotic analgesics including Percocet.  Activity: sedentary. Patient reports additional symptom's since surgery of slight spotting.   ? ?Objective: ?There were no vitals taken for this visit. ?Physical Exam ?Constitutional:   ?   General: She is not in acute distress. ?   Appearance: She is well-developed.  ?Genitourinary:  ?   Bladder and urethral meatus normal.  ?   Genitourinary Comments: Cuff intact/ no lesions ? ?Absent uterus and cervix  ?   Vaginal cuff intact. ?   No vaginal erythema or bleeding.  ? ?   Right Adnexa: not tender and no mass present. ?   Left Adnexa: not tender and no mass present. ?   Cervix is absent.  ?   Uterus is absent.  ?   Pelvic exam was performed with patient in the lithotomy position.  ?HENT:  ?   Head: Normocephalic and atraumatic.  ?   Nose: Nose normal.  ?Abdominal:  ?   General: There is no distension.  ?   Palpations: Abdomen is soft.  ?   Tenderness: There is no abdominal tenderness.  ?Musculoskeletal:     ?   General: Normal range of motion.  ?Neurological:  ?   Mental Status: She is alert and oriented to person, place, and time.  ?   Cranial Nerves: No cranial nerve deficit.  ?Skin: ?   General: Skin is warm and dry.  ?Psychiatric:     ?   Attention and Perception: Attention normal.     ?   Mood and Affect: Mood normal.     ?   Speech: Speech normal.     ?   Behavior: Behavior normal.     ?   Cognition and Memory: Cognition normal.     ?   Judgment: Judgment normal.  ? ? ?Assessment: ?s/p :   anterior colporrhaphy stable ? ?Foley Catheter removed; voiding trial discussed ? ?Plan: ?Patient has done well after surgery with no apparent complications.  I have discussed the post-operative course to date, and the expected progress moving forward.  The patient understands what complications to be concerned about.  I will see the patient in routine follow up, or sooner if needed.   ? ?Activity plan: No heavy lifting.  Pelvic rest. ? ?Hoyt Koch ?07/02/2021, 8:19 AM ? ? ?

## 2021-07-03 ENCOUNTER — Encounter: Payer: Self-pay | Admitting: Obstetrics & Gynecology

## 2021-07-03 NOTE — Telephone Encounter (Signed)
Patient is calling to see if you would extend to 07/17/21 due to pain. Please advise

## 2021-07-04 ENCOUNTER — Encounter: Payer: Self-pay | Admitting: Obstetrics & Gynecology

## 2021-07-04 NOTE — Telephone Encounter (Signed)
Done

## 2021-07-04 NOTE — Telephone Encounter (Signed)
Yes, (FMLA or work note as she needs)

## 2021-07-06 ENCOUNTER — Other Ambulatory Visit: Payer: Self-pay | Admitting: Obstetrics and Gynecology

## 2021-07-06 DIAGNOSIS — G8918 Other acute postprocedural pain: Secondary | ICD-10-CM

## 2021-07-06 MED ORDER — OXYCODONE-ACETAMINOPHEN 5-325 MG PO TABS: 1.0000 | ORAL_TABLET | Freq: Four times a day (QID) | ORAL | 0 refills | Status: DC | PRN

## 2021-07-06 NOTE — Progress Notes (Signed)
Patient called triage line complaining of severe 8/10 pain at the area of her surgical incisions. Has been taking tramadol without improvement. Previously was taking percocet which did control her pain although she felt sleepy. She reported that she is also having a yellow vaginal discharge. Her surgery was complicated by a bladder perforation. She is urinating normally. She had an indwelling catheter which was removed on Wednesday. She denies fevers. Encouraged to come to the ER for evaluation but she declines feeling that waiting in the ER would be too painful given that it hurts to sit on her bottom. Will send note to schedulers to hopefully have patient worked into clinic schedule this week. Sent rx for 10 additional percocet tablets.  ? ?Adrian Prows MD, [place] ?[facility] ?07/06/2021 ?9:15 AM ? ?

## 2021-07-07 ENCOUNTER — Ambulatory Visit: Payer: BC Managed Care – PPO | Admitting: Obstetrics & Gynecology

## 2021-07-07 ENCOUNTER — Telehealth: Payer: Self-pay

## 2021-07-07 NOTE — Telephone Encounter (Signed)
Contacted patient via phone. Left voicemail for patient to call back to be worked in to see [facility] today.

## 2021-07-07 NOTE — Telephone Encounter (Signed)
-----   Message from Homero Fellers, MD sent at 07/06/2021  9:15 AM EDT ----- Regarding: post operative pain/complication This patient called the nurse help line in severe pain and complaining of a yellow vaginal discharge. Please work her into Dr. Doreene Adas schedule ASAP this week.  Thank you,  Christanna

## 2021-07-08 ENCOUNTER — Encounter: Payer: Self-pay | Admitting: Obstetrics & Gynecology

## 2021-07-08 NOTE — Telephone Encounter (Signed)
Patient is scheduled for 07/11/21 with [facility]

## 2021-07-11 ENCOUNTER — Other Ambulatory Visit: Payer: Self-pay

## 2021-07-11 ENCOUNTER — Ambulatory Visit (INDEPENDENT_AMBULATORY_CARE_PROVIDER_SITE_OTHER): Payer: BC Managed Care – PPO | Admitting: Obstetrics & Gynecology

## 2021-07-11 ENCOUNTER — Ambulatory Visit
Admission: RE | Admit: 2021-07-11 | Discharge: 2021-07-11 | Disposition: A | Discharge status: Home / Self Care | Payer: BC Managed Care – PPO | Source: Ambulatory Visit | Attending: Obstetrics & Gynecology | Admitting: Obstetrics & Gynecology

## 2021-07-11 ENCOUNTER — Encounter: Payer: Self-pay | Admitting: Obstetrics & Gynecology

## 2021-07-11 VITALS — BP 120/80 | Ht 65.0 in | Wt 204.0 lb

## 2021-07-11 DIAGNOSIS — N9489 Other specified conditions associated with female genital organs and menstrual cycle: Secondary | ICD-10-CM | POA: Diagnosis not present

## 2021-07-11 DIAGNOSIS — G8918 Other acute postprocedural pain: Secondary | ICD-10-CM

## 2021-07-11 DIAGNOSIS — R1084 Generalized abdominal pain: Secondary | ICD-10-CM | POA: Insufficient documentation

## 2021-07-11 DIAGNOSIS — R1031 Right lower quadrant pain: Secondary | ICD-10-CM | POA: Diagnosis not present

## 2021-07-11 MED ORDER — IOHEXOL 300 MG/ML  SOLN
100.0000 mL | Freq: Once | INTRAMUSCULAR | Status: AC | PRN
  Administered 2021-07-11: 100 mL via INTRAVENOUS

## 2021-07-11 MED ORDER — HYDROCODONE-ACETAMINOPHEN 5-325 MG PO TABS: 1.0000 | ORAL_TABLET | Freq: Four times a day (QID) | ORAL | 0 refills | Status: DC | PRN

## 2021-07-11 NOTE — Progress Notes (Signed)
?  Postoperative Follow-up ?Patient presents post op from anterior colporrhaphy for pelvic relaxation and urinary incontinence, 2 weeks ago. ? ?Subjective: ?Patient reports continued lower abdominal pains, more right sided.  Some nausea, but this may also be due to narcotics.  No fever.  Feels cold at times (and this is also her baseline).  Has normal urination now.  No problems w bowel movements.  Incisions have healed, no redness or drainage. ? ?Objective: ?BP 120/80   Ht '5\' 5"'$  (1.651 m)   Wt 204 lb (92.5 kg)   BMI 33.95 kg/m?  ?Physical Exam ?Constitutional:   ?   General: She is not in acute distress. ?   Appearance: She is well-developed.  ?Genitourinary:  ?   Rectum normal.  ?   Genitourinary Comments: Anterior vaginal wall healing well.  No defects, separation, mass, or drainage.  ?   Vaginal cuff intact. ?   No vaginal erythema or bleeding.  ?   No vaginal prolapse present. ?   No vaginal atrophy present. ? ?   Right Adnexa: not tender and no mass present. ?   Left Adnexa: not tender and no mass present. ?   Cervix is absent.  ?   Uterus is absent.  ?Cardiovascular:  ?   Rate and Rhythm: Normal rate.  ?Pulmonary:  ?   Effort: Pulmonary effort is normal.  ?Abdominal:  ?   General: There is no distension.  ?   Palpations: Abdomen is soft.  ?   Tenderness: There is no abdominal tenderness.  ?   Comments: Incision healing well.  ?Musculoskeletal:     ?   General: Normal range of motion.  ?Neurological:  ?   Mental Status: She is alert and oriented to person, place, and time.  ?   Cranial Nerves: No cranial nerve deficit.  ?Skin: ?   General: Skin is warm and dry.  ? ? ?Assessment: ?s/p :  anterior colporrhaphy stable ? ?Plan: ?Pain out of proportion to timeline for healing.  May be related to perforation and urine leakage leading to inflammation, although all should be working well and healing now.   ?CT to assess this area of concern ordered today. ?Cont pain meds as needed.  Changed to Norco as Percocet  reported as too strong, and Tramadol did not help. ?No signs of infection. ? ?F/u after CT and also next week ? ?Hoyt Koch ?07/11/2021, 1:39 PM ? ? ?

## 2021-07-12 ENCOUNTER — Other Ambulatory Visit: Payer: Self-pay | Admitting: Obstetrics & Gynecology

## 2021-07-12 NOTE — Progress Notes (Signed)
CT reviewed w pateint (phone) ?Fluid area of seroma, blood, urine, other on right side in area of surgery ?Will monitor, as sx's of pain only, no sign of abscess of infection w this ?Bladder and bowel appear normal ?Pt to continue pain meds and recovery ? ?Barnett Applebaum, MD, FACOG ?[facility], [place] ?07/12/2021  8:45 AM ? ?

## 2021-07-13 ENCOUNTER — Encounter
Admission: EM | Disposition: A | Discharge status: Home / Self Care | Payer: Self-pay | Source: Home / Self Care | Attending: Obstetrics & Gynecology

## 2021-07-13 ENCOUNTER — Inpatient Hospital Stay
Admission: EM | Admit: 2021-07-13 | Discharge: 2021-07-15 | DRG: 745 | Disposition: A | Discharge status: Home / Self Care | Payer: BC Managed Care – PPO | Attending: Obstetrics & Gynecology | Admitting: Obstetrics & Gynecology

## 2021-07-13 ENCOUNTER — Other Ambulatory Visit: Payer: Self-pay

## 2021-07-13 ENCOUNTER — Emergency Department: Payer: BC Managed Care – PPO | Admitting: Certified Registered"

## 2021-07-13 ENCOUNTER — Emergency Department: Payer: BC Managed Care – PPO

## 2021-07-13 DIAGNOSIS — G8918 Other acute postprocedural pain: Secondary | ICD-10-CM | POA: Diagnosis present

## 2021-07-13 DIAGNOSIS — R188 Other ascites: Secondary | ICD-10-CM

## 2021-07-13 DIAGNOSIS — N9489 Other specified conditions associated with female genital organs and menstrual cycle: Principal | ICD-10-CM | POA: Diagnosis present

## 2021-07-13 DIAGNOSIS — R1031 Right lower quadrant pain: Secondary | ICD-10-CM | POA: Diagnosis present

## 2021-07-13 HISTORY — PX: LAPAROSCOPY: SHX197

## 2021-07-13 LAB — COMPREHENSIVE METABOLIC PANEL
ALT: 14 U/L (ref 0–44)
AST: 19 U/L (ref 15–41)
Albumin: 4.1 g/dL (ref 3.5–5.0)
Alkaline Phosphatase: 64 U/L (ref 38–126)
Anion gap: 5 (ref 5–15)
BUN: 9 mg/dL (ref 6–20)
CO2: 28 mmol/L (ref 22–32)
Calcium: 9.4 mg/dL (ref 8.9–10.3)
Chloride: 107 mmol/L (ref 98–111)
Creatinine, Ser: 0.87 mg/dL (ref 0.44–1.00)
GFR, Estimated: >60 mL/min (ref >60)
Glucose, Bld: 125 mg/dL — ABNORMAL HIGH (ref 70–99)
Potassium: 3.6 mmol/L (ref 3.5–5.1)
Sodium: 140 mmol/L (ref 135–145)
Total Bilirubin: 0.5 mg/dL (ref 0.3–1.2)
Total Protein: 7.4 g/dL (ref 6.5–8.1)

## 2021-07-13 LAB — URINALYSIS, ROUTINE W REFLEX MICROSCOPIC
Bilirubin Urine: NEGATIVE
Glucose, UA: NEGATIVE mg/dL
Ketones, ur: NEGATIVE mg/dL
Nitrite: NEGATIVE
Protein, ur: NEGATIVE mg/dL
Specific Gravity, Urine: 1.013 (ref 1.005–1.030)
WBC, UA: >50 WBC/hpf — ABNORMAL HIGH (ref 0–5)
pH: 5 (ref 5.0–8.0)

## 2021-07-13 LAB — CBC
HCT: 37.1 % (ref 36.0–46.0)
Hemoglobin: 11.8 g/dL — ABNORMAL LOW (ref 12.0–15.0)
MCH: 28.1 pg (ref 26.0–34.0)
MCHC: 31.8 g/dL (ref 30.0–36.0)
MCV: 88.3 fL (ref 80.0–100.0)
Platelets: 349 10*3/uL (ref 150–400)
RBC: 4.2 MIL/uL (ref 3.87–5.11)
RDW: 13.2 % (ref 11.5–15.5)
WBC: 8.6 10*3/uL (ref 4.0–10.5)
nRBC: 0 % (ref 0.0–0.2)

## 2021-07-13 LAB — LIPASE, BLOOD: Lipase: 30 U/L (ref 11–51)

## 2021-07-13 SURGERY — LAPAROSCOPY, DIAGNOSTIC
Anesthesia: General

## 2021-07-13 MED ORDER — IOHEXOL 300 MG/ML  SOLN
100.0000 mL | Freq: Once | INTRAMUSCULAR | Status: AC | PRN
  Administered 2021-07-13: 100 mL via INTRAVENOUS

## 2021-07-13 MED ORDER — PHENYLEPHRINE HCL-NACL 20-0.9 MG/250ML-% IV SOLN
INTRAVENOUS | Status: AC
  Filled 2021-07-13: qty 250

## 2021-07-13 MED ORDER — OXYCODONE-ACETAMINOPHEN 5-325 MG PO TABS
1.0000 | ORAL_TABLET | Freq: Once | ORAL | Status: AC
  Administered 2021-07-13: 1 via ORAL
  Filled 2021-07-13: qty 1

## 2021-07-13 MED ORDER — SUCCINYLCHOLINE CHLORIDE 200 MG/10ML IV SOSY
PREFILLED_SYRINGE | INTRAVENOUS | Status: DC | PRN
  Administered 2021-07-13: 120 mg via INTRAVENOUS

## 2021-07-13 MED ORDER — MIDAZOLAM HCL 2 MG/2ML IJ SOLN
INTRAMUSCULAR | Status: AC
  Filled 2021-07-13: qty 2

## 2021-07-13 MED ORDER — PHENYLEPHRINE HCL (PRESSORS) 10 MG/ML IV SOLN
INTRAVENOUS | Status: DC | PRN
  Administered 2021-07-13 (×3): 80 ug via INTRAVENOUS

## 2021-07-13 MED ORDER — LACTATED RINGERS IV SOLN: INTRAVENOUS | Status: DC | PRN

## 2021-07-13 MED ORDER — FENTANYL CITRATE (PF) 100 MCG/2ML IJ SOLN
INTRAMUSCULAR | Status: AC
  Filled 2021-07-13: qty 2

## 2021-07-13 MED ORDER — PROPOFOL 10 MG/ML IV BOLUS
INTRAVENOUS | Status: DC | PRN
  Administered 2021-07-13: 160 mg via INTRAVENOUS

## 2021-07-13 MED ORDER — MIDAZOLAM HCL 2 MG/2ML IJ SOLN
INTRAMUSCULAR | Status: DC | PRN
  Administered 2021-07-13: 2 mg via INTRAVENOUS

## 2021-07-13 MED ORDER — ROCURONIUM BROMIDE 100 MG/10ML IV SOLN
INTRAVENOUS | Status: DC | PRN
  Administered 2021-07-13: 50 mg via INTRAVENOUS

## 2021-07-13 MED ORDER — PROPOFOL 10 MG/ML IV BOLUS
INTRAVENOUS | Status: AC
  Filled 2021-07-13: qty 20

## 2021-07-13 MED ORDER — FENTANYL CITRATE (PF) 100 MCG/2ML IJ SOLN
INTRAMUSCULAR | Status: DC | PRN
  Administered 2021-07-13: 100 ug via INTRAVENOUS

## 2021-07-13 MED ORDER — LIDOCAINE HCL (CARDIAC) PF 100 MG/5ML IV SOSY
PREFILLED_SYRINGE | INTRAVENOUS | Status: DC | PRN
  Administered 2021-07-13: 100 mg via INTRAVENOUS

## 2021-07-13 SURGICAL SUPPLY — 46 items
ADH SKN CLS APL DERMABOND .7 (GAUZE/BANDAGES/DRESSINGS) ×1
APL PRP STRL LF DISP 70% ISPRP (MISCELLANEOUS) ×1
BACTOSHIELD CHG 4% 4OZ (MISCELLANEOUS) ×1
BAG RETRIEVAL 10 (BASKET)
BLADE SURG SZ11 CARB STEEL (BLADE) ×2 IMPLANT
CATH ROBINSON RED A/P 16FR (CATHETERS) ×2 IMPLANT
CHLORAPREP W/TINT 26 (MISCELLANEOUS) ×2 IMPLANT
DERMABOND ADVANCED (GAUZE/BANDAGES/DRESSINGS) ×1
DERMABOND ADVANCED .7 DNX12 (GAUZE/BANDAGES/DRESSINGS) ×1 IMPLANT
DRSG TEGADERM 2-3/8X2-3/4 SM (GAUZE/BANDAGES/DRESSINGS) ×6 IMPLANT
DRSG TELFA 4X3 1S NADH ST (GAUZE/BANDAGES/DRESSINGS) IMPLANT
GAUZE 4X4 16PLY ~~LOC~~+RFID DBL (SPONGE) ×4 IMPLANT
GLOVE SURG ENC MOIS LTX SZ8 (GLOVE) ×2 IMPLANT
GLOVE SURG UNDER LTX SZ8 (GLOVE) ×2 IMPLANT
GOWN STRL REUS W/ TWL LRG LVL3 (GOWN DISPOSABLE) ×1 IMPLANT
GOWN STRL REUS W/ TWL XL LVL3 (GOWN DISPOSABLE) ×1 IMPLANT
GOWN STRL REUS W/TWL LRG LVL3 (GOWN DISPOSABLE) ×2
GOWN STRL REUS W/TWL XL LVL3 (GOWN DISPOSABLE) ×2
GRASPER SUT TROCAR 14GX15 (MISCELLANEOUS) ×2 IMPLANT
IRRIGATION STRYKERFLOW (MISCELLANEOUS) IMPLANT
IRRIGATOR STRYKERFLOW (MISCELLANEOUS)
IV LACTATED RINGERS 1000ML (IV SOLUTION) IMPLANT
KIT PINK PAD W/HEAD ARE REST (MISCELLANEOUS) ×2 IMPLANT
KIT PINK PAD W/HEAD ARM REST (MISCELLANEOUS) ×1 IMPLANT
LABEL OR SOLS (LABEL) ×2 IMPLANT
MANIFOLD NEPTUNE II (INSTRUMENTS) ×2 IMPLANT
NEEDLE VERESS 14GA 120MM (NEEDLE) ×2 IMPLANT
NS IRRIG 500ML POUR BTL (IV SOLUTION) ×2 IMPLANT
PACK GYN LAPAROSCOPIC (MISCELLANEOUS) ×2 IMPLANT
PAD PREP 24X41 OB/GYN DISP (PERSONAL CARE ITEMS) ×2 IMPLANT
SCISSORS METZENBAUM CVD 33 (INSTRUMENTS) ×2 IMPLANT
SCRUB CHG 4% DYNA-HEX 4OZ (MISCELLANEOUS) ×1 IMPLANT
SET TUBE SMOKE EVAC HIGH FLOW (TUBING) ×2 IMPLANT
SHEARS HARMONIC ACE PLUS 36CM (ENDOMECHANICALS) IMPLANT
SLEEVE ENDOPATH XCEL 5M (ENDOMECHANICALS) IMPLANT
SOL PREP PROV IODINE SCRUB 4OZ (MISCELLANEOUS) ×2 IMPLANT
SPONGE GAUZE 2X2 8PLY STRL LF (GAUZE/BANDAGES/DRESSINGS) ×6 IMPLANT
STRAP SAFETY 5IN WIDE (MISCELLANEOUS) ×2 IMPLANT
SUT VIC AB 2-0 UR6 27 (SUTURE) IMPLANT
SUT VIC AB 4-0 PS2 18 (SUTURE) IMPLANT
SYR 10ML LL (SYRINGE) ×2 IMPLANT
SYS BAG RETRIEVAL 10MM (BASKET)
SYSTEM BAG RETRIEVAL 10MM (BASKET) IMPLANT
TROCAR ENDO BLADELESS 11MM (ENDOMECHANICALS) IMPLANT
TROCAR XCEL NON-BLD 5MMX100MML (ENDOMECHANICALS) ×2 IMPLANT
WATER STERILE IRR 500ML POUR (IV SOLUTION) ×2 IMPLANT

## 2021-07-13 NOTE — ED Notes (Signed)
Patient to bathroom to remove home clothing and jewelry to prepare for surgery. ?

## 2021-07-13 NOTE — ED Notes (Signed)
Patient to CT via wheelchair.

## 2021-07-13 NOTE — ED Triage Notes (Signed)
Pt via POV from home. Pt c/o RLQ pain and R sided groin pain. Pt states she had surgery and her bladder ruptured on 3/2. States it started last night and it is sharp in nature. Pt does endorse some nausea and vomiting. Pt did have an outpatient CT done on Friday. Pt is A&OX4 and NAD ? ?

## 2021-07-13 NOTE — ED Notes (Signed)
Report given to [place], [place] in the [facility]. ?

## 2021-07-13 NOTE — ED Notes (Signed)
Patient's husband took all belongings with him (clothing, shoes, wallet and jewelry) home.  Patient kept cell phone (I-phone) and glasses. ?

## 2021-07-13 NOTE — H&P (Signed)
Obstetrics & Gynecology History and Physical Note ? ?Date of Consultation: 07/13/2021  ? ?Requesting Provider: [facility] ? ?Primary OBGYN: Kenton Kingfisher ?Primary Care Provider: Carol Ada ? ?Reason for Consultation: RLQ pain ? ?History of Present Illness: [name] is a 41 y.o. G2P0002 (No LMP recorded. Patient has had a hysterectomy.), with the above CC. She had anterior colporrhaphy with attempted pubovaginal sling procedure 2+ weeks ago, complicated by bladder perforation.  She initally had foley, and has had normal urination, bowel movements.  She mostly has RLQ pain w activity.  Some SOB on exertion.  No fever chills diarrhea.  Mild nausea.  She has had 2 CT scans, one today.  She has a fluid collection along the RLQ area where sling surgery occurs internally.  Unclear as to etiology of fluid.  She has normal WBC count. ? ?ROS: A review of systems was performed and was complete and comprehensive, except as stated in the above HPI. ? ?OBGYN History: As per HPI. ?OB History   ? ? Gravida  ?2  ? Para  ?2  ? Term  ?0  ? Preterm  ?0  ? AB  ?0  ? Living  ?2  ?  ? ? SAB  ?0  ? IAB  ?0  ? Ectopic  ?0  ? Multiple  ?0  ? Live Births  ?0  ?   ?  ?  ? ?  ?Past Medical History: ?Past Medical History:  ?Diagnosis Date  ? Chest pain   ? Endometriosis   ? Family history of adverse reaction to anesthesia   ? Family history of heart disease   ? History of kidney stones 01/2021  ? Scoliosis   ? ? ?Past Surgical History: ?Past Surgical History:  ?Procedure Laterality Date  ? ABDOMINAL HYSTERECTOMY    ? APPENDECTOMY    ? BACK SURGERY    ? Harrington Rod placement  ? CESAREAN SECTION    ? x2  ? CYSTOCELE REPAIR N/A 06/26/2021  ? Procedure: ANTERIOR REPAIR (CYSTOCELE);  Surgeon: Gae Dry, MD;  Location: [facility] ORS;  Service: Gynecology;  Laterality: N/A;  ? CYSTOSCOPY N/A 06/26/2021  ? Procedure: CYSTOSCOPY;  Surgeon: Gae Dry, MD;  Location: [facility] ORS;  Service: Gynecology;  Laterality: N/A;  ? endometrial surgery    ? FOOT  SURGERY    ? KNEE SURGERY    ? OOPHORECTOMY    ? ? ?Family History:  ?Family History  ?Problem Relation Age of Onset  ? Hypertension Mother   ? Cancer Father   ?     bladder  ? Heart disease Father   ? Breast cancer Neg Hx   ? ?She denies any female cancers, bleeding or blood clotting disorders.  ? ?Social History:  ?Social History  ? ?Socioeconomic History  ? Marital status: Married  ?  Spouse name: Not on file  ? Number of children: Not on file  ? Years of education: Not on file  ? Highest education level: Not on file  ?Occupational History  ? Not on file  ?Tobacco Use  ? Smoking status: Never  ? Smokeless tobacco: Never  ?Vaping Use  ? Vaping Use: Never used  ?Substance and Sexual Activity  ? Alcohol use: No  ? Drug use: No  ? Sexual activity: Yes  ?  Birth control/protection: Surgical  ?  Comment: Hysterectomy  ?Other Topics Concern  ? Not on file  ?Social History Narrative  ? Not on file  ? ?Social Determinants of Health  ? ?  Financial Resource Strain: Not on file  ?Food Insecurity: Not on file  ?Transportation Needs: Not on file  ?Physical Activity: Not on file  ?Stress: Not on file  ?Social Connections: Not on file  ?Intimate Partner Violence: Not on file  ? ? ?Allergy: ?No Known Allergies ? ?Current Outpatient Medications: ?(Not in a hospital admission) ? ? ?Hospital Medications: ?No current facility-administered medications for this encounter.  ? ?Current Outpatient Medications  ?Medication Sig Dispense Refill  ? escitalopram (LEXAPRO) 10 MG tablet Take 10 mg by mouth daily.    ? HYDROcodone-acetaminophen (NORCO) 5-325 MG tablet Take 1 tablet by mouth every 6 (six) hours as needed for moderate pain. 30 tablet 0  ? naproxen (NAPROSYN) 500 MG tablet Take 500 mg by mouth 2 (two) times daily as needed for pain.    ? ? ?Physical Exam: ?Vitals:  ? 07/13/21 1728 07/13/21 1728 07/13/21 2135  ?BP:  (!) 142/89 (!) 144/88  ?Pulse:  78 68  ?Resp:  18 18  ?Temp:  98.4 ?F (36.9 ?C)   ?TempSrc:  Oral   ?SpO2:  99% 98%   ?Weight: 92.5 kg    ?Height: '5\' 5"'$  (1.651 m)    ?Body mass index is 33.95 kg/m?Marland Kitchen ?Constitutional: Well nourished, well developed female in no acute distress.  ?HEENT: normal ?Neck:  Supple, normal appearance, and no thyromegaly  ?Cardiovascular:Regular rate and rhythm.  No murmurs, rubs or gallops. ?Respiratory:  Clear to auscultation bilateral. Normal respiratory effort ?Abdomen: positive bowel sounds and no masses, hernias; diffusely non tender to palpation, non distended;  ONLY T is DEEP PALPATION near GROIN AREA LOWER RIGHT LOWER QUADRANT ?Neuro: grossly intact ?Psych:  Normal mood and affect.  ?Skin:  Warm and dry.  ?MS: normal gait and normal bilateral lower extremity strength/ROM/symmetry ?Lymphatic:  No inguinal lymphadenopathy.  ? ?Recent Labs  ?Lab 07/13/21 ?1731  ?WBC 8.6  ?HGB 11.8*  ?HCT 37.1  ?PLT 349  ? ?Recent Labs  ?Lab 07/13/21 ?1731  ?NA 140  ?K 3.6  ?CL 107  ?CO2 28  ?BUN 9  ?CREATININE 0.87  ?CALCIUM 9.4  ?PROT 7.4  ?BILITOT 0.5  ?ALKPHOS 64  ?ALT 14  ?AST 19  ?GLUCOSE 125*  ? ?Imaging:  ?CT:   Adrenals/Urinary Tract: The kidneys and adrenal glands are ?unremarkable except for RIGHT renal cysts. No bladder abnormalities ?are identified. An unchanged 3.5 x 7 cm RIGHT pelvic sidewall ?collection (series 3: Image 72) slightly displaces the bladder to ?the LEFT but no other bladder abnormalities are noted. ?  ?Stomach/Bowel: A small hiatal hernia is noted. There is no evidence ?of bowel obstruction, bowel wall thickening or inflammatory changes. ?Colonic diverticulosis noted without evidence of acute ?diverticulitis. ?  ?Vascular/Lymphatic: No significant vascular findings are present. No ?enlarged abdominal or pelvic lymph nodes. ?  ?Reproductive: Status post hysterectomy. No adnexal masses. ?  ?Other: No ascites or pneumoperitoneum identified. A small umbilical ?hernia containing fat is again noted. ? ?Assessment: [name] is a 41 y.o. G2P0002 (No LMP recorded. Patient has had a hysterectomy.)  who presented to the ED with complaints of RLQ pain; findings are consistent with a fluid collection related likely to prior surgery.  Cannot say for sure whether blood or seroma or urine.  No other evidence for infection or abscess.   ? ?Plan: ?Management options include diagnostic laparoscopy to assess, vs continued observation.  As this is the patients evaluation of the pain and fluid collection, and she prefers more aggressive approach, we will proceed with laparoscopy.  Pros and cons of surgery discussed.  May be limited due to adhesions.  New added risks operating in area of recent prior surgery with its inflammation discussed. ? ?I have had a careful discussion with this patient about all the options available and the risk/benefits of each. I have fully informed this patient that surgery may subject her to a variety of discomforts and risks: She understands that most patients have surgery with little difficulty, but problems can happen ranging from minor to fatal. These include nausea, vomiting, pain, bleeding, infection, poor healing, hernia, or formation of adhesions. Unexpected reactions may occur from any drug or anesthetic given. Unintended injury may occur to other pelvic or abdominal structures such as Fallopian tubes, ovaries, bladder, ureter (tube from kidney to bladder), or bowel. Nerves going from the pelvis to the legs may be injured. Any such injury may require immediate or later additional surgery to correct the problem. Excessive blood loss requiring transfusion is very unlikely but possible. Dangerous blood clots may form in the legs or lungs. Physical and sexual activity will be restricted in varying degrees for an indeterminate period of time but most often 2-6 weeks.  Finally, she understands that it is impossible to list every possible undesirable effect and that the condition for which surgery is done is not always cured or significantly improved, and in rare cases may be even worse.Ample  time was given to answer all questions. ? ? ?Barnett Applebaum, MD, FACOG ?[facility], [place] ?07/13/2021  9:53 PM ?Pager [phone] ? ?

## 2021-07-13 NOTE — Anesthesia Preprocedure Evaluation (Signed)
Anesthesia Evaluation  ?Patient identified by MRN, date of birth, ID band ?Patient awake ? ? ? ?Reviewed: ?Allergy & Precautions, NPO status , Patient's Chart, lab work & pertinent test results ? ?History of Anesthesia Complications ?Negative for: history of anesthetic complications ? ?Airway ?Mallampati: II ? ?TM Distance: >3 FB ?Neck ROM: Full ? ? ? Dental ?no notable dental hx. ?(+) Teeth Intact ?  ?Pulmonary ?neg pulmonary ROS, neg sleep apnea, neg COPD, Patient abstained from smoking.Not current smoker,  ?  ?Pulmonary exam normal ?breath sounds clear to auscultation ? ? ? ? ? ? Cardiovascular ?Exercise Tolerance: Good ?METS(-) hypertension(-) CAD and (-) Past MI negative cardio ROS ? ?(-) dysrhythmias  ?Rhythm:Regular Rate:Normal ?- Systolic murmurs ? ?  ?Neuro/Psych ?negative neurological ROS ? negative psych ROS  ? GI/Hepatic ?neg GERD  ,(+)  ?  ? (-) substance abuse ? ,   ?Endo/Other  ?neg diabetes ? Renal/GU ?negative Renal ROS  ? ?  ?Musculoskeletal ? ? Abdominal ?  ?Peds ? Hematology ?  ?Anesthesia Other Findings ?Past Medical History: ?No date: Chest pain ?No date: Endometriosis ?No date: Family history of adverse reaction to anesthesia ?No date: Family history of heart disease ?01/2021: History of kidney stones ?No date: Scoliosis ? Reproductive/Obstetrics ? ?  ? ? ? ? ? ? ? ? ? ? ? ? ? ?  ?  ? ? ? ? ? ? ? ? ?Anesthesia Physical ?Anesthesia Plan ? ?ASA: 2 and emergent ? ?Anesthesia Plan: General  ? ?Post-op Pain Management: Toradol IV (intra-op)*, Ofirmev IV (intra-op)* and Dilaudid IV  ? ?Induction: Intravenous and Rapid sequence ? ?PONV Risk Score and Plan: 4 or greater and Ondansetron, Dexamethasone and Midazolam ? ?Airway Management Planned: Oral ETT ? ?Additional Equipment: None ? ?Intra-op Plan:  ? ?Post-operative Plan: Extubation in OR ? ?Informed Consent: I have reviewed the patients History and Physical, chart, labs and discussed the procedure including the  risks, benefits and alternatives for the proposed anesthesia with the patient or authorized representative who has indicated his/her understanding and acceptance.  ? ? ? ?Dental advisory given ? ?Plan Discussed with: CRNA and Surgeon ? ?Anesthesia Plan Comments: (Patient ate half a bag of potato chips at 6am. Dr Kenton Kingfisher is declaring this case emergent. ? ?Discussed risks of anesthesia with patient, including PONV, sore throat, lip/dental/eye damage, aspiration. Rare risks discussed as well, such as cardiorespiratory and neurological sequelae, and allergic reactions. Discussed the role of CRNA in patient's perioperative care. Patient understands.)  ? ? ? ? ? ? ?Anesthesia Quick Evaluation ? ?

## 2021-07-13 NOTE — ED Notes (Signed)
Dr. Kenton Kingfisher speaking with patient. ?

## 2021-07-13 NOTE — ED Notes (Addendum)
Patient reports she has surgery at the beginning of March and was having a bladder sling placed but the right side of her bladder was ruptured.  Patient reports began having increasing pain especially with exertion.  Patient had CT scan on Friday.  Patient reports pain began to increase around 1:30 am took Percocet she had at home that didn't really help the pain.  Reports she was at [place] walking around and pain increased a lot. Patient rates pain 4/10 at this time. ?

## 2021-07-13 NOTE — ED Provider Notes (Signed)
? ?[facility] ?Provider Note ? ? ? Event Date/Time  ? First MD Initiated Contact with Patient 07/13/21 1958   ?  (approximate) ? ? ?History  ? ?Abdominal Pain ? ? ?HPI ? ?Crystal Pearson is a 41 y.o. female status post cystoscopy and bladder perforation repair on 3/2 who presents with worsening right lower abdominal/pelvic pain since last night.  The patient has had continued pain since the surgery and had a CT 3/17 showing a fluid collection in the right pelvis consistent with a seroma or hematoma.  The patient states that since 1 AM last night the pain increased and she had an episode of vomiting.  She tried to go out and go to a store today but with minimal walking she had severe pain.  She states that it is more significant than when she was seen on 3/17.  She has had some diarrhea since the surgery but has been on stool softeners.  She denies any dysuria or frequency.  She has no vaginal bleeding. ? ? ? ?Physical Exam  ? ?Triage Vital Signs: ?ED Triage Vitals  ?Enc Vitals Group  ?   BP 07/13/21 1728 (!) 142/89  ?   Pulse Rate 07/13/21 1728 78  ?   Resp 07/13/21 1728 18  ?   Temp 07/13/21 1728 98.4 ?F (36.9 ?C)  ?   Temp Source 07/13/21 1728 Oral  ?   SpO2 07/13/21 1728 99 %  ?   Weight 07/13/21 1728 204 lb (92.5 kg)  ?   Height 07/13/21 1728 '5\' 5"'$  (1.651 m)  ?   Head Circumference --   ?   Peak Flow --   ?   Pain Score 07/13/21 1728 10  ?   Pain Loc --   ?   Pain Edu? --   ?   Excl. in [place]? --   ? ? ?Most recent vital signs: ?Vitals:  ? 07/13/21 1728 07/13/21 2135  ?BP: (!) 142/89 (!) 144/88  ?Pulse: 78 68  ?Resp: 18 18  ?Temp: 98.4 ?F (36.9 ?C)   ?SpO2: 99% 98%  ? ? ? ?General: Awake, no distress.  ?CV:  Good peripheral perfusion.  ?Resp:  Normal effort.  ?Abd:  Soft with an area of focal tenderness to the right pubic area.  Abdomen otherwise nontender and nondistended.   ?Other:  No scleral icterus. ? ? ?ED Results / Procedures / Treatments  ? ?Labs ?(all labs ordered are listed, but only  abnormal results are displayed) ?Labs Reviewed  ?COMPREHENSIVE METABOLIC PANEL - Abnormal; Notable for the following components:  ?    Result Value  ? Glucose, Bld 125 (*)   ? All other components within normal limits  ?CBC - Abnormal; Notable for the following components:  ? Hemoglobin 11.8 (*)   ? All other components within normal limits  ?URINALYSIS, ROUTINE W REFLEX MICROSCOPIC - Abnormal; Notable for the following components:  ? Color, Urine YELLOW (*)   ? APPearance HAZY (*)   ? Hgb urine dipstick MODERATE (*)   ? Leukocytes,Ua MODERATE (*)   ? WBC, UA >50 (*)   ? Bacteria, UA RARE (*)   ? All other components within normal limits  ?LIPASE, BLOOD  ? ? ? ?EKG ? ? ? ? ?RADIOLOGY ? ?CT abdomen/pelvis: I independently viewed and interpreted the images.  There is a fluid collection in the right pelvic sidewall unchanged from the prior CT from 3/17. ? ?PROCEDURES: ? ?Critical Care performed: No ? ?  Procedures ? ? ?MEDICATIONS ORDERED IN ED: ?Medications  ?oxyCODONE-acetaminophen (PERCOCET/ROXICET) 5-325 MG per tablet 1 tablet (1 tablet Oral Given 07/13/21 2046)  ?iohexol (OMNIPAQUE) 300 MG/ML solution 100 mL (100 mLs Intravenous Contrast Given 07/13/21 2032)  ? ? ? ?IMPRESSION / MDM / ASSESSMENT AND PLAN / ED COURSE  ?I reviewed the triage vital signs and the nursing notes. ? ?41 year old female status post cystoscopy and bladder perforation on 3/2 presents with worsening right pelvic pain since last night. ? ?I reviewed the past medical records.  The patient initially had the procedure for stress incontinence on 3/2 and was planned for pubovaginal sling, however she sustained a small bladder perforation.  She was seen by Dr. Kenton Kingfisher from OB/GYN 3/17 and had a CT that day which shows a 7 x 4 cm ovoid fluid collection in the right pelvis, likely either seroma or hematoma. ? ?On exam the patient is tender in this area.  Her vital signs are normal.  The physical exam is otherwise unremarkable. ? ?Lab work-up today is  unremarkable.  She has no leukocytosis.  Blood chemistry is normal.  Urinalysis shows WBCs and rare bacteria but no other significant findings. ? ?Overall pain is most likely related to the fluid collection on CT although there is no evidence clinically of abscess as the patient is afebrile and has no leukocytosis. ? ?I will consult Dr. Kenton Kingfisher from OB/GYN to determine need for additional imaging or work-up and plan of care. ? ?----------------------------------------- ?9:55 PM on 07/13/2021 ?----------------------------------------- ? ?Dr. Kenton Kingfisher recommended a repeat CT to evaluate for increase in size of the fluid collection.  It shows that the fluid collection is unchanged, however the patient is frustrated with her continued pain and would prefer an intervention if possible.  I discussed again with Dr. Kenton Kingfisher who has now seen the patient in the ED and agrees to take her to the operating room for laparoscopy. ? ? ?FINAL CLINICAL IMPRESSION(S) / ED DIAGNOSES  ? ?Final diagnoses:  ?Pelvic fluid collection  ? ? ? ?Rx / DC Orders  ? ?ED Discharge Orders   ? ? None  ? ?  ? ? ? ?Note:  This document was prepared using Dragon voice recognition software and may include unintentional dictation errors.  ?  Arta Silence, MD ?07/13/21 2157 ? ?

## 2021-07-14 ENCOUNTER — Encounter: Payer: Self-pay | Admitting: Obstetrics & Gynecology

## 2021-07-14 ENCOUNTER — Other Ambulatory Visit: Payer: Self-pay

## 2021-07-14 DIAGNOSIS — K6811 Postprocedural retroperitoneal abscess: Secondary | ICD-10-CM

## 2021-07-14 DIAGNOSIS — R1031 Right lower quadrant pain: Secondary | ICD-10-CM | POA: Diagnosis present

## 2021-07-14 DIAGNOSIS — N9489 Other specified conditions associated with female genital organs and menstrual cycle: Secondary | ICD-10-CM | POA: Diagnosis present

## 2021-07-14 DIAGNOSIS — G8918 Other acute postprocedural pain: Secondary | ICD-10-CM | POA: Diagnosis present

## 2021-07-14 MED ORDER — ONDANSETRON HCL 4 MG/2ML IJ SOLN
INTRAMUSCULAR | Status: DC | PRN
  Administered 2021-07-14: 4 mg via INTRAVENOUS

## 2021-07-14 MED ORDER — KETOROLAC TROMETHAMINE 30 MG/ML IJ SOLN
30.0000 mg | Freq: Four times a day (QID) | INTRAMUSCULAR | Status: AC
  Administered 2021-07-14 (×3): 30 mg via INTRAVENOUS
  Filled 2021-07-14 (×3): qty 1

## 2021-07-14 MED ORDER — OXYCODONE HCL 5 MG/5ML PO SOLN: 5.0000 mg | Freq: Once | ORAL | Status: DC | PRN

## 2021-07-14 MED ORDER — ROCURONIUM BROMIDE 10 MG/ML (PF) SYRINGE
PREFILLED_SYRINGE | INTRAVENOUS | Status: AC
  Filled 2021-07-14: qty 10

## 2021-07-14 MED ORDER — ONDANSETRON HCL 4 MG/2ML IJ SOLN: 4.0000 mg | Freq: Four times a day (QID) | INTRAMUSCULAR | Status: DC | PRN

## 2021-07-14 MED ORDER — ONDANSETRON HCL 4 MG/2ML IJ SOLN: 4.0000 mg | Freq: Once | INTRAMUSCULAR | Status: DC | PRN

## 2021-07-14 MED ORDER — OXYCODONE HCL 5 MG PO TABS: 5.0000 mg | ORAL_TABLET | Freq: Once | ORAL | Status: DC | PRN

## 2021-07-14 MED ORDER — KETOROLAC TROMETHAMINE 30 MG/ML IJ SOLN
INTRAMUSCULAR | Status: DC | PRN
  Administered 2021-07-14: 30 mg via INTRAVENOUS

## 2021-07-14 MED ORDER — SIMETHICONE 80 MG PO CHEW
80.0000 mg | CHEWABLE_TABLET | Freq: Four times a day (QID) | ORAL | Status: DC | PRN
  Administered 2021-07-14 (×3): 80 mg via ORAL
  Filled 2021-07-14 (×3): qty 1

## 2021-07-14 MED ORDER — IBUPROFEN 600 MG PO TABS
600.0000 mg | ORAL_TABLET | Freq: Four times a day (QID) | ORAL | Status: DC
  Administered 2021-07-14 – 2021-07-15 (×2): 600 mg via ORAL
  Filled 2021-07-14 (×2): qty 1

## 2021-07-14 MED ORDER — OXYCODONE HCL 5 MG PO TABS
5.0000 mg | ORAL_TABLET | ORAL | Status: DC | PRN
  Administered 2021-07-14 – 2021-07-15 (×6): 10 mg via ORAL
  Filled 2021-07-14 (×6): qty 2

## 2021-07-14 MED ORDER — MORPHINE SULFATE (PF) 2 MG/ML IV SOLN
1.0000 mg | INTRAVENOUS | Status: DC | PRN
  Administered 2021-07-14 (×3): 2 mg via INTRAVENOUS
  Administered 2021-07-14: 1 mg via INTRAVENOUS
  Administered 2021-07-15 (×2): 2 mg via INTRAVENOUS
  Filled 2021-07-14 (×5): qty 1

## 2021-07-14 MED ORDER — KETOROLAC TROMETHAMINE 30 MG/ML IJ SOLN
INTRAMUSCULAR | Status: AC
  Filled 2021-07-14: qty 1

## 2021-07-14 MED ORDER — ONDANSETRON HCL 4 MG PO TABS: 4.0000 mg | ORAL_TABLET | Freq: Four times a day (QID) | ORAL | Status: DC | PRN

## 2021-07-14 MED ORDER — ACETAMINOPHEN 325 MG PO TABS
650.0000 mg | ORAL_TABLET | ORAL | Status: DC | PRN
  Administered 2021-07-14 – 2021-07-15 (×5): 650 mg via ORAL
  Filled 2021-07-14 (×5): qty 2

## 2021-07-14 MED ORDER — LACTATED RINGERS IV SOLN: INTRAVENOUS | Status: DC

## 2021-07-14 MED ORDER — SUCCINYLCHOLINE CHLORIDE 200 MG/10ML IV SOSY
PREFILLED_SYRINGE | INTRAVENOUS | Status: AC
  Filled 2021-07-14: qty 10

## 2021-07-14 MED ORDER — FENTANYL CITRATE (PF) 100 MCG/2ML IJ SOLN
INTRAMUSCULAR | Status: AC
  Administered 2021-07-14: 50 ug via INTRAVENOUS
  Filled 2021-07-14: qty 2

## 2021-07-14 MED ORDER — ONDANSETRON HCL 4 MG/2ML IJ SOLN
INTRAMUSCULAR | Status: AC
  Filled 2021-07-14: qty 2

## 2021-07-14 MED ORDER — DEXAMETHASONE SODIUM PHOSPHATE 10 MG/ML IJ SOLN
INTRAMUSCULAR | Status: DC | PRN
Start: 2021-07-14 — End: 2021-07-14
  Administered 2021-07-14: 10 mg via INTRAVENOUS

## 2021-07-14 MED ORDER — DEXAMETHASONE SODIUM PHOSPHATE 10 MG/ML IJ SOLN
INTRAMUSCULAR | Status: AC
  Filled 2021-07-14: qty 1

## 2021-07-14 MED ORDER — HYDROMORPHONE HCL 1 MG/ML IJ SOLN: 0.5000 mg | INTRAMUSCULAR | Status: DC | PRN

## 2021-07-14 MED ORDER — ESCITALOPRAM OXALATE 10 MG PO TABS
10.0000 mg | ORAL_TABLET | Freq: Every day | ORAL | Status: DC
Start: 2021-07-15 — End: 2021-07-15
  Administered 2021-07-15: 10 mg via ORAL
  Filled 2021-07-14: qty 1

## 2021-07-14 MED ORDER — FENTANYL CITRATE (PF) 100 MCG/2ML IJ SOLN
25.0000 ug | INTRAMUSCULAR | Status: DC | PRN
  Administered 2021-07-14: 50 ug via INTRAVENOUS

## 2021-07-14 MED ORDER — SUGAMMADEX SODIUM 200 MG/2ML IV SOLN
INTRAVENOUS | Status: DC | PRN
  Administered 2021-07-14 (×2): 200 mg via INTRAVENOUS

## 2021-07-14 MED ORDER — DOCUSATE SODIUM 100 MG PO CAPS
100.0000 mg | ORAL_CAPSULE | Freq: Two times a day (BID) | ORAL | Status: DC
  Administered 2021-07-14 – 2021-07-15 (×4): 100 mg via ORAL
  Filled 2021-07-14 (×4): qty 1

## 2021-07-14 NOTE — Transfer of Care (Signed)
Immediate Anesthesia Transfer of Care Note ? ?Patient: Crystal Pearson ? ?Procedure(s) Performed: LAPAROSCOPY DIAGNOSTIC ? ?Patient Location: PACU ? ?Anesthesia Type:General ? ?Level of Consciousness: drowsy ? ?Airway & Oxygen Therapy: Patient Spontanous Breathing ? ?Post-op Assessment: Report given to RN and Post -op Vital signs reviewed and stable ? ?Post vital signs: stable ? ?Last Vitals:  ?Vitals Value Taken Time  ?BP 127/78 07/14/21 0021  ?Temp 36.7 ?C 07/14/21 0021  ?Pulse 88 07/14/21 0021  ?Resp 15 07/14/21 0021  ?SpO2 97 % 07/14/21 0021  ?Vitals shown include unvalidated device data. ? ?Last Pain:  ?Vitals:  ? 07/14/21 0021  ?TempSrc: Skin  ?PainSc:   ?   ? ?  ? ?Complications: No notable events documented. ?

## 2021-07-14 NOTE — Anesthesia Postprocedure Evaluation (Signed)
Anesthesia Post Note ? ?Patient: Crystal Pearson ? ?Procedure(s) Performed: LAPAROSCOPY DIAGNOSTIC ? ?Patient location during evaluation: PACU ?Anesthesia Type: General ?Level of consciousness: awake and alert ?Pain management: pain level controlled ?Vital Signs Assessment: post-procedure vital signs reviewed and stable ?Respiratory status: spontaneous breathing, nonlabored ventilation, respiratory function stable and patient connected to nasal cannula oxygen ?Cardiovascular status: blood pressure returned to baseline and stable ?Postop Assessment: no apparent nausea or vomiting ?Anesthetic complications: no ? ? ?No notable events documented. ? ? ?Last Vitals:  ?Vitals:  ? 07/14/21 0021 07/14/21 0030  ?BP: 127/78 128/87  ?Pulse: 94 85  ?Resp: 18 (!) 25  ?Temp: 36.7 ?C   ?SpO2: 96% 99%  ?  ?Last Pain:  ?Vitals:  ? 07/14/21 0021  ?TempSrc: Skin  ?PainSc:   ? ? ?  ?  ?  ?  ?  ?  ? ?Arita Miss ? ? ? ? ?

## 2021-07-14 NOTE — Progress Notes (Signed)
1 Day Post-Op Procedure(s) (LRB): ?LAPAROSCOPY DIAGNOSTIC (N/A) ? ?Subjective: ?Patient reports  pain in same area, RLQ, as well as fatigue from past few days of pain and no sleep .   ? ?Objective: ?I have reviewed patient's vital signs, intake and output, medications, and labs. ? ?Abd: RLQ T to paplaption in lower area, ND ?Incision: clean, dry, and intact ?Extr: no calf T, no edema ? ?Assessment: ?s/p Procedure(s): ?LAPAROSCOPY DIAGNOSTIC (N/A): stable ? ?Plan: ?Advance diet ?Encourage ambulation ?Advance to PO medication ?Plan continued stay for pain relief and rest; monitoring of hematoma found at surgery ?Anticipated date of discharge 07/15/21 ? ? LOS: 1 day  ? ? ?Crystal Pearson ?07/14/2021, 7:47 AM ? ? ? ? ?

## 2021-07-14 NOTE — Op Note (Signed)
?  Operative Note  ? ?07/14/2021 ? ?PRE-OP DIAGNOSIS: RLQ Pain ?Pelvic fluid collection   ? ?POST-OP DIAGNOSIS: same ?  ?PROCEDURE: Procedure(s): ?LAPAROSCOPY DIAGNOSTIC  ? ?SURGEON: Barnett Applebaum, MD, FACOG ? ?ANESTHESIA: Choice  ? ?ESTIMATED BLOOD LOSS: Min ? ?COMPLICATIONS:  none ? ?DISPOSITION: PACU - hemodynamically stable. ? ?CONDITION: stable ? ?FINDINGS: Laparoscopic survey of the abdomen revealed a grossly normal left ovary, liver edge, gallbladder edge and appendix, Minimal intra-abdominal adhesions were noted. The was a right sided lower quadrant sub-peritoneal hematoma c/w CT findings; no extension into intra-abdominal cavity. ? ?PROCEDURE IN DETAIL: The patient was taken to the OR where anesthesia was administed. The patient was positioned supine after foley placed.  Patient is prepped and draped. ? ?Attention was turned to the patient?s abdomen where a 5 mm skin incision was made in the umbilical fold, after injection of local anesthesia. The Veress step needle was carefully introduced into the peritoneal cavity with placement confirmed using the hanging drop technique.  Pneumoperitoneum was obtained. The 5 mm port was then placed under direct visualization with the operative laparoscope.  Trendelenburg positioning. ? ?Additional 5 mm trocar was then placed in the RLQ lateral to the inferior epigastric blood vessels under direct visualization with the laparoscope.  Instrumentation to visualize complete pelvic anatomy performed.   ? ?The findings above are visualized.  There is deemed no benefit to incising the swelling (hematoma).  There is no drainage or evidence for purulence.  Bowel and other organs appear normal and unaffected. ? ?Instruments and trocars removed, gas expelled, and skin closed with skin adhesive glue.  Instrument, needle, and sponge counts correct x2 at the conclusion of the case.  Pt goes to recovery room in stable condition. ? ?Barnett Applebaum, MD, [place] ?[facility], [place] ?07/14/2021  12:21 AM ? ?

## 2021-07-15 MED ORDER — OXYCODONE-ACETAMINOPHEN 5-325 MG PO TABS: 1.0000 | ORAL_TABLET | ORAL | 0 refills | Status: DC | PRN

## 2021-07-15 NOTE — Discharge Summary (Signed)
Gynecology Physician Postoperative Discharge Summary  ?Patient ID: ?Crystal Pearson ?MRN: [id number] ?DOB/AGE: Jul 13, 1980 41 y.o. ? ?Admit Date: 07/13/2021 ?Discharge Date: 07/15/2021 ? ?Preoperative Diagnoses: Pelvic hematoma ? ?Procedures: Procedure(s) (LRB): ?LAPAROSCOPY DIAGNOSTIC (N/A) ? ?Significant Labs: ?CBC Latest Ref Rng & Units 07/13/2021 06/23/2021 07/14/2013  ?WBC 4.0 - 10.5 K/uL 8.6 9.5 9.5  ?Hemoglobin 12.0 - 15.0 g/dL 11.8(L) 12.5 12.5  ?Hematocrit 36.0 - 46.0 % 37.1 39.2 38.1  ?Platelets 150 - 400 K/uL 349 294 294  ? ? ?Hospital Course:  ?Crystal Pearson is a 41 y.o. [id number]  admitted for scheduled surgery.  She underwent the procedures as mentioned above, her operation was uncomplicated. For further details about surgery, please refer to the operative report. Patient had an uncomplicated postoperative course. By time of discharge on POD#1, her pain was controlled on oral pain medications; she was ambulating, voiding without difficulty, tolerating regular diet and passing flatus. She was deemed stable for discharge to home.  ? ?Discharge Exam: ?Blood pressure 111/68, pulse 81, temperature 98.5 ?F (36.9 ?C), temperature source Oral, resp. rate 18, height '5\' 5"'$  (1.651 m), weight 92.5 kg, SpO2 97 %. ?General appearance: alert and no distress  ?Resp: clear to auscultation bilaterally  ?Cardio: regular rate and rhythm  ?GI: soft, non-tender; bowel sounds normal; no masses, no organomegaly.  ?Incision: C/D/I, no erythema, no drainage noted ?Pelvic: scant blood on pad  ?Extremities: extremities normal, atraumatic, no cyanosis or edema and Homans sign is negative, no sign of DVT ? ?Discharged Condition: Stable ? ?Disposition: Discharge disposition: 01-Home or Self Care ? ? ? ? ? ? ?Discharge Instructions   ? ? Call MD for:  persistant nausea and vomiting   Complete by: As directed ?  ? Call MD for:  redness, tenderness, or signs of infection (pain, swelling, redness, odor or green/yellow discharge around incision  site)   Complete by: As directed ?  ? Call MD for:  severe uncontrolled pain   Complete by: As directed ?  ? Call MD for:  temperature >100.4   Complete by: As directed ?  ? Diet - low sodium heart healthy   Complete by: As directed ?  ? Discharge instructions   Complete by: As directed ?  ? Resume activities according to discharge instruction sheets  ? If the dressing is still on your incision site when you go home, remove it on the third day after your surgery date. Remove dressing if it begins to fall off, or if it is dirty or damaged before the third day.   Complete by: As directed ?  ? Increase activity slowly   Complete by: As directed ?  ? ?  ? ?Allergies as of 07/15/2021   ?No Known Allergies ?  ? ?  ?Medication List  ?  ? ?STOP taking these medications   ? ?HYDROcodone-acetaminophen 5-325 MG tablet ?Commonly known as: Norco ?  ? ?  ? ?TAKE these medications   ? ?escitalopram 10 MG tablet ?Commonly known as: LEXAPRO ?Take 10 mg by mouth daily. ?  ?naproxen 500 MG tablet ?Commonly known as: NAPROSYN ?Take 500 mg by mouth 2 (two) times daily as needed for pain. ?  ?oxyCODONE-acetaminophen 5-325 MG tablet ?Commonly known as: Percocet ?Take 1 tablet by mouth every 4 (four) hours as needed for moderate pain or severe pain. ?  ? ?  ? ?  ?  ? ? ?  ?Discharge Care Instructions  ?(From admission, onward)  ?  ? ? ?  ? ?  Start     Ordered  ? 07/15/21 0000  If the dressing is still on your incision site when you go home, remove it on the third day after your surgery date. Remove dressing if it begins to fall off, or if it is dirty or damaged before the third day.       ? 07/15/21 0736  ? ?  ?  ? ?  ? ? ? ?Barnett Applebaum, MD ? ?

## 2021-07-15 NOTE — Discharge Instructions (Addendum)
General Gynecological Post-Operative Instructions ?You may expect to feel dizzy, weak, and drowsy for as long as 24 hours after receiving the medicine that made you sleep (anesthetic).  ?Do not drive a car, ride a bicycle, participate in physical activities, or take public transportation until you are done taking narcotic pain medicines or as directed by your doctor.  ?Do not drink alcohol or take tranquilizers.  ?Do not take medicine that has not been prescribed by your doctor.  ?Do not sign important papers or make important decisions while on narcotic pain medicines.  ?Have a responsible person with you.  ?CARE OF INCISION  ?Keep incision clean and dry. ?Take showers instead of baths until your doctor gives you permission to take baths.  ?Avoid heavy lifting (more than 10 pounds/4.5 kilograms), pushing, or pulling.  ?Avoid activities that may risk injury to your surgical site.  ?No sexual intercourse or placement of anything in the vagina for 6 weeks or as instructed by your doctor. ?If you have tubes coming from the wound site, check with your doctor regarding appropriate care of the tubes. ?Only take prescription or over-the-counter medicines  for pain, discomfort, or fever as directed by your doctor. Do not take aspirin. It can make you bleed. Take medicines (antibiotics) that kill germs if they are prescribed for you.  ?Call the office or go to the ER if:  ?You feel sick to your stomach (nauseous) and you start to throw up (vomit).  ?You have trouble eating or drinking.  ?You have an oral temperature above 101.  ?You have constipation that is not helped by adjusting diet or increasing fluid intake. Pain medicines are a common cause of constipation.  ?You have any other concerns. ?SEEK IMMEDIATE MEDICAL CARE IF:  ?You have persistent dizziness.  ?You have difficulty breathing or a congested sounding (croupy) cough.  ?You have an oral temperature above 102.5, not controlled by medicine.  ?There is increasing  pain or tenderness near or in the surgical site.  ? ? ?

## 2021-07-15 NOTE — Progress Notes (Signed)
Pt discharged home.  Discharge instructions, prescriptions and follow up appointment given to and reviewed with pt.  Pt verbalized understanding.  Escorted by auxillary. 

## 2021-07-16 ENCOUNTER — Encounter: Payer: Self-pay | Admitting: Obstetrics & Gynecology

## 2021-07-16 ENCOUNTER — Encounter: Payer: BC Managed Care – PPO | Admitting: Obstetrics & Gynecology

## 2021-07-22 ENCOUNTER — Encounter: Payer: BC Managed Care – PPO | Admitting: Obstetrics & Gynecology

## 2021-07-23 ENCOUNTER — Other Ambulatory Visit: Payer: Self-pay | Admitting: Obstetrics & Gynecology

## 2021-07-23 ENCOUNTER — Other Ambulatory Visit: Payer: Self-pay

## 2021-07-23 ENCOUNTER — Ambulatory Visit (INDEPENDENT_AMBULATORY_CARE_PROVIDER_SITE_OTHER): Payer: BC Managed Care – PPO | Admitting: Obstetrics & Gynecology

## 2021-07-23 ENCOUNTER — Encounter: Payer: Self-pay | Admitting: Obstetrics & Gynecology

## 2021-07-23 VITALS — BP 124/72 | HR 84 | Ht 65.0 in | Wt 201.0 lb

## 2021-07-23 DIAGNOSIS — R1084 Generalized abdominal pain: Secondary | ICD-10-CM

## 2021-07-23 DIAGNOSIS — G8918 Other acute postprocedural pain: Secondary | ICD-10-CM

## 2021-07-23 NOTE — Progress Notes (Signed)
?  Postoperative Follow-up ?Patient presents post op from anterior colporrhaphy for pelvic relaxation, 1 month ago and Dx Lap for pelvic pain and hematoma 1 week ago. ? ?Subjective: ?Patient reports some improvement in her RLQ pain symptoms.  Some improved activity.   Eating a regular diet without difficulty. Also voiding and BM's without diffculty.Pain is controlled with current analgesics. Medications being used: ibuprofen (OTC) and narcotic analgesics including Percocet.  Activity: sedentary. Patient reports additional symptom's since surgery of None. ? ?Objective: ?BP 124/72   Pulse 84   Ht '5\' 5"'$  (1.651 m)   Wt 201 lb (91.2 kg)   BMI 33.45 kg/m?  ?Physical Exam ?Constitutional:   ?   General: She is not in acute distress. ?   Appearance: She is well-developed.  ?Musculoskeletal:     ?   General: Normal range of motion.  ?Neurological:  ?   Mental Status: She is alert and oriented to person, place, and time.  ?Skin: ?   General: Skin is warm and dry.  ?Vitals reviewed.  ? ?Assessment: ?s/p :  anterior colporrhaphy and also dx lap  stable ?Improving slowly, and is counseled this is appropriate recovery for this diagnosis of hematoma/bruising from the sling/anterior repair surgery ? ?Plan: ?Patient has done well after surgery with no apparent complications.  I have discussed the post-operative course to date, and the expected progress moving forward.  The patient understands what complications to be concerned about.  I will see the patient in routine follow up, or sooner if needed.   ? ?Options for referral to re-assess RLQ and options.  Also CT.  Recheck next week. ?Activity plan: No heavy lifting.  Pelvic rest. ? ?Hoyt Koch ?07/23/2021, 10:45 AM ? ? ?

## 2021-07-24 ENCOUNTER — Encounter: Payer: Self-pay | Admitting: Obstetrics & Gynecology

## 2021-07-24 MED ORDER — OXYCODONE-ACETAMINOPHEN 5-325 MG PO TABS: 1.0000 | ORAL_TABLET | ORAL | 0 refills | Status: DC | PRN

## 2021-07-25 ENCOUNTER — Encounter: Payer: Self-pay | Admitting: Obstetrics & Gynecology

## 2021-07-25 NOTE — Telephone Encounter (Signed)
This patient had surgical complication with Kenton Kingfisher so he is most appropriate to give advise. If she is having fevers then she should go to the ER for evaluation and labs. If she just want to check for a UTI she can drop off urine here or consider being seen at an urgent care. Its a short day today though so she would need to drop off urine within the next two hours.

## 2021-07-29 ENCOUNTER — Telehealth: Payer: Self-pay

## 2021-07-29 NOTE — Telephone Encounter (Signed)
Spoke with pt and let her know that I will be redoing her FMLA (since hers is not scanned into her chart yet, and they are needing it ASAP as they say they did not receive it, but I got confirmation of fax). Will complete and get RPH to re sign tomorrow and will refax and make her a copy as well  ?

## 2021-07-30 ENCOUNTER — Encounter: Payer: Self-pay | Admitting: Obstetrics & Gynecology

## 2021-07-30 ENCOUNTER — Ambulatory Visit (INDEPENDENT_AMBULATORY_CARE_PROVIDER_SITE_OTHER): Payer: BC Managed Care – PPO | Admitting: Obstetrics & Gynecology

## 2021-07-30 DIAGNOSIS — R1031 Right lower quadrant pain: Secondary | ICD-10-CM

## 2021-07-30 NOTE — Progress Notes (Signed)
Virtual Visit via Telephone Note ? ?I connected with Crystal Pearson on 07/30/21 at 10:15 AM EDT by telephone and verified that I am speaking with the correct person using two identifiers. ? ?Location: ?Patient: Home ?Provider: Office ?  ?I discussed the limitations, risks, security and privacy concerns of performing an evaluation and management service by telephone and the availability of in person appointments. I also discussed with the patient that there may be a patient responsible charge related to this service. The patient expressed understanding and agreed to proceed. ? ? ?History of Present Illness: ? ?Postoperative Follow-up ?Patient presents post op from  dx lap and prior AR  for urinary incontinence,  2 and 5 weeks  ago. ? ?Subjective: ?Patient reports some improvement in her pain symptoms.  Able to ambulate more and not taking narcotics any longer.   Eating a regular diet without difficulty. Pain is controlled with current analgesics. Medications being used: naproxen (OTC).  Activity: sedentary. Patient reports additional symptom's since surgery of some scant vag d/c. ?  ?Observations/Objective: ?No exam today, due to telephone eVisit due to [facility] virus restriction on elective visits and procedures.  Prior visits reviewed along with ultrasounds/labs as indicated. ? ?Assessment and Plan: ?  ICD-10-CM   ?1. RLQ abdominal pain  R10.31   ?  ? ?Follow Up Instructions: ?2 weeks as scheduled ?Post op check and vaginal exam  ?This will be 6 weeks from [place] surgery ?Cont to allow time to heal from right sided hematoma ?  ?I discussed the assessment and treatment plan with the patient. The patient was provided an opportunity to ask questions and all were answered. The patient agreed with the plan and demonstrated an understanding of the instructions. ?  ?The patient was advised to call back or seek an in-person evaluation if the symptoms worsen or if the condition fails to improve as anticipated. ? ?I provided 15  minutes of non-face-to-face time during this encounter. ? ? ?Hoyt Koch, MD ? ? ?

## 2021-08-12 ENCOUNTER — Other Ambulatory Visit (HOSPITAL_COMMUNITY)
Admission: RE | Admit: 2021-08-12 | Discharge: 2021-08-12 | Disposition: A | Discharge status: Home / Self Care | Payer: BC Managed Care – PPO | Source: Ambulatory Visit | Attending: Obstetrics & Gynecology | Admitting: Obstetrics & Gynecology

## 2021-08-12 ENCOUNTER — Encounter: Payer: Self-pay | Admitting: Obstetrics & Gynecology

## 2021-08-12 ENCOUNTER — Ambulatory Visit (INDEPENDENT_AMBULATORY_CARE_PROVIDER_SITE_OTHER): Payer: BC Managed Care – PPO | Admitting: Obstetrics & Gynecology

## 2021-08-12 VITALS — BP 128/80 | Ht 65.0 in | Wt 205.0 lb

## 2021-08-12 DIAGNOSIS — Z9889 Other specified postprocedural states: Secondary | ICD-10-CM

## 2021-08-12 DIAGNOSIS — N898 Other specified noninflammatory disorders of vagina: Secondary | ICD-10-CM | POA: Insufficient documentation

## 2021-08-12 MED ORDER — METRONIDAZOLE 500 MG PO TABS: 500.0000 mg | ORAL_TABLET | Freq: Two times a day (BID) | ORAL | 0 refills | Status: DC

## 2021-08-12 NOTE — Progress Notes (Signed)
?  Postoperative Follow-up ?Patient presents post op from anterior colporrhaphy for pelvic relaxation, 6 weeks ago. ? ?Subjective: ?Patient reports some improvement in her preop symptoms. Eating a regular diet without difficulty. The patient is not having any surgical site pain, yet still does have RLQ pain, improving very gradually (post hematoma as diagnosed by CT and Dx Lap).  One episode of vag bleeding 3 days ago.  One episode of GSI yesterday.   Activity: normal activities of daily living. Patient reports additional symptom's since surgery of None. ? ?Objective: ?BP 128/80   Ht '5\' 5"'$  (1.651 m)   Wt 205 lb (93 kg)   BMI 34.11 kg/m?  ?Physical Exam ?Constitutional:   ?   General: She is not in acute distress. ?   Appearance: She is well-developed.  ?Genitourinary:  ?   Rectum normal.  ?   Genitourinary Comments: Small area at [place] that represents area of bleed, mucosal only  ?   Perineal sutures intact. ?   No vaginal erythema or bleeding.  ? ?   Right Adnexa: not tender and no mass present. ?   Left Adnexa: not tender and no mass present. ?   No cervical motion tenderness, discharge or lesion.  ?   Uterus is absent.  ?   Pelvic exam was performed with patient in the lithotomy position.  ?Cardiovascular:  ?   Rate and Rhythm: Normal rate.  ?Pulmonary:  ?   Effort: Pulmonary effort is normal.  ?Abdominal:  ?   General: There is no distension.  ?   Palpations: Abdomen is soft.  ?   Tenderness: There is no abdominal tenderness.  ?   Comments: Incision healing well.  ?Musculoskeletal:     ?   General: Normal range of motion.  ?Neurological:  ?   Mental Status: She is alert and oriented to person, place, and time.  ?   Cranial Nerves: No cranial nerve deficit.  ?Skin: ?   General: Skin is warm and dry.  ? ? ?Assessment: ?s/p :  anterior colporrhaphy progressing well ? ?Plan: ?Patient has done well after surgery with no apparent complications.  I have discussed the post-operative course to date, and the expected  progress moving forward.  The patient understands what complications to be concerned about.  I will see the patient in routine follow up, or sooner if needed.   ? ?Activity plan: No restriction.  Pelvic rest for 2 more weeks. ?Plan to monitor for resolution of RLQ pain and hematoma from surgery.  CT and/or other follow up based on protracted pain. ? ?Hoyt Koch ?08/12/2021, 2:48 PM ? ? ?

## 2021-08-14 LAB — CERVICOVAGINAL ANCILLARY ONLY
Bacterial Vaginitis (gardnerella): NEGATIVE
Candida Glabrata: NEGATIVE
Candida Vaginitis: NEGATIVE
Comment: NEGATIVE
Comment: NEGATIVE
Comment: NEGATIVE

## 2021-08-18 ENCOUNTER — Other Ambulatory Visit: Payer: Self-pay

## 2021-08-18 ENCOUNTER — Encounter (HOSPITAL_COMMUNITY): Payer: Self-pay | Admitting: Emergency Medicine

## 2021-08-18 ENCOUNTER — Emergency Department (HOSPITAL_COMMUNITY): Payer: BC Managed Care – PPO

## 2021-08-18 ENCOUNTER — Observation Stay (HOSPITAL_COMMUNITY)
Admission: EM | Admit: 2021-08-18 | Discharge: 2021-08-20 | Disposition: A | Discharge status: Home / Self Care | Payer: BC Managed Care – PPO | Attending: Student | Admitting: Student

## 2021-08-18 DIAGNOSIS — K259 Gastric ulcer, unspecified as acute or chronic, without hemorrhage or perforation: Secondary | ICD-10-CM | POA: Diagnosis not present

## 2021-08-18 DIAGNOSIS — K2210 Ulcer of esophagus without bleeding: Secondary | ICD-10-CM | POA: Diagnosis not present

## 2021-08-18 DIAGNOSIS — R0789 Other chest pain: Secondary | ICD-10-CM | POA: Diagnosis not present

## 2021-08-18 DIAGNOSIS — Z79899 Other long term (current) drug therapy: Secondary | ICD-10-CM | POA: Diagnosis not present

## 2021-08-18 DIAGNOSIS — R030 Elevated blood-pressure reading, without diagnosis of hypertension: Secondary | ICD-10-CM | POA: Diagnosis not present

## 2021-08-18 DIAGNOSIS — E669 Obesity, unspecified: Secondary | ICD-10-CM | POA: Diagnosis present

## 2021-08-18 DIAGNOSIS — R079 Chest pain, unspecified: Secondary | ICD-10-CM | POA: Diagnosis present

## 2021-08-18 DIAGNOSIS — F39 Unspecified mood [affective] disorder: Secondary | ICD-10-CM | POA: Insufficient documentation

## 2021-08-18 DIAGNOSIS — R112 Nausea with vomiting, unspecified: Secondary | ICD-10-CM

## 2021-08-18 DIAGNOSIS — K2100 Gastro-esophageal reflux disease with esophagitis, without bleeding: Secondary | ICD-10-CM | POA: Diagnosis not present

## 2021-08-18 DIAGNOSIS — K449 Diaphragmatic hernia without obstruction or gangrene: Secondary | ICD-10-CM | POA: Insufficient documentation

## 2021-08-18 DIAGNOSIS — K2980 Duodenitis without bleeding: Secondary | ICD-10-CM | POA: Diagnosis not present

## 2021-08-18 DIAGNOSIS — R1310 Dysphagia, unspecified: Secondary | ICD-10-CM

## 2021-08-18 DIAGNOSIS — Z9049 Acquired absence of other specified parts of digestive tract: Secondary | ICD-10-CM | POA: Insufficient documentation

## 2021-08-18 DIAGNOSIS — K209 Esophagitis, unspecified without bleeding: Secondary | ICD-10-CM

## 2021-08-18 LAB — BASIC METABOLIC PANEL
Anion gap: 8 (ref 5–15)
BUN: 10 mg/dL (ref 6–20)
CO2: 27 mmol/L (ref 22–32)
Calcium: 9.8 mg/dL (ref 8.9–10.3)
Chloride: 104 mmol/L (ref 98–111)
Creatinine, Ser: 0.74 mg/dL (ref 0.44–1.00)
GFR, Estimated: >60 mL/min (ref >60)
Glucose, Bld: 104 mg/dL — ABNORMAL HIGH (ref 70–99)
Potassium: 3.8 mmol/L (ref 3.5–5.1)
Sodium: 139 mmol/L (ref 135–145)

## 2021-08-18 LAB — CBC
HCT: 41.4 % (ref 36.0–46.0)
Hemoglobin: 13.1 g/dL (ref 12.0–15.0)
MCH: 27.9 pg (ref 26.0–34.0)
MCHC: 31.6 g/dL (ref 30.0–36.0)
MCV: 88.3 fL (ref 80.0–100.0)
Platelets: 347 10*3/uL (ref 150–400)
RBC: 4.69 MIL/uL (ref 3.87–5.11)
RDW: 13.4 % (ref 11.5–15.5)
WBC: 10 10*3/uL (ref 4.0–10.5)
nRBC: 0 % (ref 0.0–0.2)

## 2021-08-18 LAB — I-STAT BETA HCG BLOOD, ED (MC, WL, AP ONLY): I-stat hCG, quantitative: <5 m[IU]/mL (ref <5)

## 2021-08-18 LAB — TROPONIN I (HIGH SENSITIVITY): Troponin I (High Sensitivity): 2 ng/L (ref <18)

## 2021-08-18 MED ORDER — GLUCAGON HCL RDNA (DIAGNOSTIC) 1 MG IJ SOLR
1.0000 mg | Freq: Once | INTRAMUSCULAR | Status: AC
  Administered 2021-08-18: 1 mg via INTRAMUSCULAR
  Filled 2021-08-18: qty 1

## 2021-08-18 MED ORDER — MORPHINE SULFATE (PF) 4 MG/ML IV SOLN
4.0000 mg | Freq: Once | INTRAVENOUS | Status: AC
  Administered 2021-08-18: 4 mg via INTRAVENOUS
  Filled 2021-08-18: qty 1

## 2021-08-18 MED ORDER — LIDOCAINE VISCOUS HCL 2 % MT SOLN
15.0000 mL | Freq: Once | OROMUCOSAL | Status: AC
Start: 2021-08-18 — End: 2021-08-18
  Administered 2021-08-18: 15 mL via ORAL
  Filled 2021-08-18: qty 15

## 2021-08-18 MED ORDER — ALUM & MAG HYDROXIDE-SIMETH 200-200-20 MG/5ML PO SUSP
30.0000 mL | Freq: Once | ORAL | Status: AC
  Administered 2021-08-18: 30 mL via ORAL
  Filled 2021-08-18: qty 30

## 2021-08-18 MED ORDER — ONDANSETRON HCL 4 MG/2ML IJ SOLN
4.0000 mg | Freq: Once | INTRAMUSCULAR | Status: AC
Start: 2021-08-18 — End: 2021-08-18
  Administered 2021-08-18: 4 mg via INTRAVENOUS
  Filled 2021-08-18: qty 2

## 2021-08-18 MED ORDER — PANTOPRAZOLE SODIUM 40 MG PO TBEC
40.0000 mg | DELAYED_RELEASE_TABLET | Freq: Every day | ORAL | Status: DC
  Administered 2021-08-18: 40 mg via ORAL
  Filled 2021-08-18: qty 1

## 2021-08-18 MED ORDER — ONDANSETRON 4 MG PO TBDP
4.0000 mg | ORAL_TABLET | Freq: Once | ORAL | Status: AC
Start: 2021-08-18 — End: 2021-08-18
  Administered 2021-08-18: 4 mg via ORAL
  Filled 2021-08-18: qty 1

## 2021-08-18 NOTE — ED Provider Triage Note (Signed)
Emergency Medicine Provider Triage Evaluation Note ? ?Crystal Pearson , a 41 y.o. female  was evaluated in triage.  Pt complains of chest pain since eating a baked potato that was stuck in her throat last week.  Patient reports nausea and vomiting after ingesting the baked potato, denies blood in stool or vomit.  Patient denies worsening of chest pain with exertion.  Patient endorsing slight shortness of breath. ? ?Review of Systems  ?Positive:  ?Negative:  ? ?Physical Exam  ?BP (!) 159/107 (BP Location: Left Arm)   Pulse 74   Temp 98.7 ?F (37.1 ?C) (Oral)   Resp 17   SpO2 100%  ?Gen:   Awake, no distress   ?Resp:  Normal effort  ?MSK:   Moves extremities without difficulty  ?Other:   ? ?Medical Decision Making  ?Medically screening exam initiated at 2:44 PM.  Appropriate orders placed.  Crystal Pearson was informed that the remainder of the evaluation will be completed by another provider, this initial triage assessment does not replace that evaluation, and the importance of remaining in the ED until their evaluation is complete. ? ? ?  ?Azucena Cecil, PA-C ?08/18/21 1445 ? ?

## 2021-08-18 NOTE — ED Triage Notes (Signed)
Pt Arrives POV w/ c/o chest pain since sar=turday. Pt reports chocking on some food on Saturday. Pt also c/o N/V/D. Pain in central chest.  ?

## 2021-08-18 NOTE — ED Provider Notes (Signed)
?[facility] DEPT ?Provider Note ? ? ?CSN: [id number] ?Arrival date & time: 08/18/21  1201 ? ?  ? ?History ? ?Chief Complaint  ?Patient presents with  ? Chest Pain  ? ? ?Crystal Pearson is a 41 y.o. female with medical history of endometriosis, scoliosis.  The patient reports to ED for evaluation of chest pressure.  Patient states that since she had bladder sling surgery in March, she has had pain in her chest.  The patient states that this past weekend, on Saturday, she was at the beach with her family.  The patient states that they ate at [place], she had a baked potato as well as a steak, patient reports that at this time she experienced centralized chest/sternal pressure immediately after eating baked potato.  The patient reports that she immediately vomited 5 times, was hyperventilating.  Patient states that since then she has had centralized chest/sternal pressure, globus sensation.  Patient states she went to her PCP today who sent her to ER for evaluation due to chest pain.  Patient endorsing centralized chest pressure, nausea, vomiting, decreased oral intake.  The patient denies any shortness of breath, leg swelling, left arm pain, blood in vomit. ? ? ?Chest Pain ?Associated symptoms: nausea and vomiting   ?Associated symptoms: no fever and no shortness of breath   ? ?  ? ?Home Medications ?Prior to Admission medications   ?Medication Sig Start Date End Date Taking? Authorizing Provider  ?escitalopram (LEXAPRO) 5 MG tablet Take 5 mg by mouth daily. 07/07/21   [provider]  ?metroNIDAZOLE (FLAGYL) 500 MG tablet Take 1 tablet (500 mg total) by mouth 2 (two) times daily. 08/12/21   Gae Dry, MD  ?naproxen (NAPROSYN) 500 MG tablet Take 500 mg by mouth 2 (two) times daily as needed for pain. 06/16/21   [provider]  ?FLUoxetine (PROZAC) 10 MG capsule  07/06/18 07/23/19  [provider]  ?   ? ?Allergies    ?Patient has no known allergies.    ? ?Review of Systems   ?Review of Systems  ?Constitutional:  Negative for fever.  ?Respiratory:  Negative for shortness of breath.   ?Cardiovascular:  Positive for chest pain. Negative for leg swelling.  ?Gastrointestinal:  Positive for nausea and vomiting.  ?All other systems reviewed and are negative. ? ?Physical Exam ?Updated Vital Signs ?BP 132/82   Pulse 81   Temp 98.7 ?F (37.1 ?C) (Oral)   Resp 16   SpO2 94%  ?Physical Exam ?Vitals and nursing note reviewed.  ?Constitutional:   ?   General: She is not in acute distress. ?   Appearance: She is not ill-appearing, toxic-appearing or diaphoretic.  ?HENT:  ?   Head: Normocephalic and atraumatic.  ?   Nose: Nose normal.  ?   Mouth/Throat:  ?   Mouth: Mucous membranes are moist.  ?   Pharynx: Oropharynx is clear. No oropharyngeal exudate or posterior oropharyngeal erythema.  ?Eyes:  ?   Extraocular Movements: Extraocular movements intact.  ?   Pupils: Pupils are equal, round, and reactive to light.  ?Neck:  ?   Vascular: No JVD.  ?Cardiovascular:  ?   Rate and Rhythm: Normal rate and regular rhythm.  ?   Pulses: Normal pulses.  ?   Heart sounds: Normal heart sounds.  ?Pulmonary:  ?   Effort: Pulmonary effort is normal.  ?   Breath sounds: No decreased breath sounds, wheezing, rhonchi or rales.  ?Chest:  ?  Chest wall: Tenderness present.  ?Abdominal:  ?   General: Abdomen is flat. Bowel sounds are normal.  ?   Palpations: Abdomen is soft.  ?   Tenderness: There is no abdominal tenderness.  ?Musculoskeletal:     ?   General: Normal range of motion.  ?   Cervical back: Normal range of motion and neck supple.  ?   Right lower leg: No edema.  ?   Left lower leg: No edema.  ?Skin: ?   General: Skin is warm and dry.  ?   Capillary Refill: Capillary refill takes less than 2 seconds.  ?Neurological:  ?   Mental Status: She is alert and oriented to person, place, and time.  ? ? ?ED Results / Procedures / Treatments   ?Labs ?(all labs ordered are listed, but only  abnormal results are displayed) ?Labs Reviewed  ?BASIC METABOLIC PANEL - Abnormal; Notable for the following components:  ?    Result Value  ? Glucose, Bld 104 (*)   ? All other components within normal limits  ?CBC  ?I-STAT BETA HCG BLOOD, ED ([facility], [facility], [facility] ONLY)  ?TROPONIN I (HIGH SENSITIVITY)  ? ? ?EKG ?None ? ?Radiology ?DG Chest 2 View ? ?Result Date: 08/18/2021 ?CLINICAL DATA:  Chest pain EXAM: CHEST - 2 VIEW COMPARISON:  Chest x-ray dated February 20, 2021 FINDINGS: The heart size and mediastinal contours are within normal limits. Both lungs are clear. The visualized skeletal structures are unremarkable. Thoracolumbar spine hardware noted. IMPRESSION: No active cardiopulmonary disease. Electronically Signed   By: Yetta Glassman M.D.   On: 08/18/2021 13:43   ? ?Procedures ?Procedures  ? ? ?Medications Ordered in ED ?Medications  ?pantoprazole (PROTONIX) EC tablet 40 mg (40 mg Oral Given 08/18/21 1650)  ?alum & mag hydroxide-simeth (MAALOX/MYLANTA) 200-200-20 MG/5ML suspension 30 mL (30 mLs Oral Given 08/18/21 1651)  ?  And  ?lidocaine (XYLOCAINE) 2 % viscous mouth solution 15 mL (15 mLs Oral Given 08/18/21 1651)  ?glucagon (human recombinant) (GLUCAGEN) injection 1 mg (1 mg Intramuscular Given 08/18/21 1730)  ?ondansetron (ZOFRAN-ODT) disintegrating tablet 4 mg (4 mg Oral Given 08/18/21 1917)  ?ondansetron [facility]) injection 4 mg (4 mg Intravenous Given 08/18/21 1936)  ?morphine (PF) 4 MG/ML injection 4 mg (4 mg Intravenous Given 08/18/21 2032)  ? ? ?ED Course/ Medical Decision Making/ A&P ?  ?                        ?Medical Decision Making ?Amount and/or Complexity of Data Reviewed ?Labs: ordered. ?Radiology: ordered. ? ?Risk ?OTC drugs. ?Prescription drug management. ?Decision regarding hospitalization. ? ? ?41 year old female presents ED for evaluation of chest pressure/globus sensation.  Please see HPI for further details. ? ?On examination, patient afebrile, nontachycardic, not hypoxic.  Patient lung sounds clear  bilaterally.  Patient abdomen soft compressible in all 4 quadrants.  Patient alert and orient x4.  Patient follows commands.  Patient nontoxic in appearance. ? ?Patient worked up utilizing following labs and imaging studies interpreted by me personally: ?- Initial troponin 2 ?- BMP shows elevated glucose to 104 however noncontributory to symptoms ?- CBC unremarkable ?- Chest x-ray shows no signs of cardiomegaly, effusion, consolidation.  There is no obvious food bolus noted on plain film imaging. ? ?Patient treated with GI cocktail, 40 mg Protonix, 1 mg glucagon, 4 mg Zofran ODT.  Patient reports after administration of these medication she has no relief of her symptoms.   ? ?Initially patient was tolerating  liquid diet, was able to swallow small sips of liquid here in the department.  I contacted GI, Dr. Watt Climes advised that this patient could be discharged home and they could follow-up in the office tomorrow morning.  After discussing the patient with Dr. Watt Climes, the patient began having 4 episodes of nausea and vomiting.  Patient was given 4 mg IV Zofran however continued to vomit.  At this time, I got back in touch with Dr. Watt Climes who advised this patient to be admitted through hospitalist service for EGD versus bar swallow in the morning.  I appreciate GIs assistance. ? ?Hospitalist, Dr. Flossie Buffy, has been consulted for admission.  Dr. Flossie Buffy has agreed admit the patient.  The patient is amenable to the plan the patient is stable at the time of admission. ? ? ?Final Clinical Impression(s) / ED Diagnoses ?Final diagnoses:  ?Chest pain, unspecified type  ? ? ?Rx / DC Orders ?ED Discharge Orders   ? ? None  ? ?  ? ? ?  ?Azucena Cecil, PA-C ?08/18/21 2109 ? ?  ?Lianne Cure, DO ?75/79/72 1511 ? ?

## 2021-08-19 ENCOUNTER — Encounter (HOSPITAL_COMMUNITY): Payer: Self-pay | Admitting: Family Medicine

## 2021-08-19 ENCOUNTER — Encounter (HOSPITAL_COMMUNITY)
Admission: EM | Disposition: A | Discharge status: Home / Self Care | Payer: Self-pay | Source: Home / Self Care | Attending: Emergency Medicine

## 2021-08-19 ENCOUNTER — Observation Stay (HOSPITAL_COMMUNITY): Payer: BC Managed Care – PPO | Admitting: Anesthesiology

## 2021-08-19 DIAGNOSIS — R079 Chest pain, unspecified: Secondary | ICD-10-CM | POA: Diagnosis not present

## 2021-08-19 DIAGNOSIS — R112 Nausea with vomiting, unspecified: Secondary | ICD-10-CM

## 2021-08-19 DIAGNOSIS — R1310 Dysphagia, unspecified: Secondary | ICD-10-CM

## 2021-08-19 DIAGNOSIS — R030 Elevated blood-pressure reading, without diagnosis of hypertension: Secondary | ICD-10-CM

## 2021-08-19 HISTORY — PX: BIOPSY: SHX5522

## 2021-08-19 HISTORY — PX: ESOPHAGOGASTRODUODENOSCOPY: SHX5428

## 2021-08-19 LAB — BASIC METABOLIC PANEL
Anion gap: 7 (ref 5–15)
BUN: 9 mg/dL (ref 6–20)
CO2: 26 mmol/L (ref 22–32)
Calcium: 8.9 mg/dL (ref 8.9–10.3)
Chloride: 105 mmol/L (ref 98–111)
Creatinine, Ser: 0.8 mg/dL (ref 0.44–1.00)
GFR, Estimated: >60 mL/min (ref >60)
Glucose, Bld: 92 mg/dL (ref 70–99)
Potassium: 3.8 mmol/L (ref 3.5–5.1)
Sodium: 138 mmol/L (ref 135–145)

## 2021-08-19 SURGERY — EGD (ESOPHAGOGASTRODUODENOSCOPY)
Anesthesia: Monitor Anesthesia Care

## 2021-08-19 MED ORDER — PROPOFOL 500 MG/50ML IV EMUL
INTRAVENOUS | Status: DC | PRN
Start: 2021-08-19 — End: 2021-08-19
  Administered 2021-08-19: 150 ug/kg/min via INTRAVENOUS

## 2021-08-19 MED ORDER — SODIUM CHLORIDE 0.9 % IV SOLN: INTRAVENOUS | Status: DC

## 2021-08-19 MED ORDER — HYDROMORPHONE HCL 1 MG/ML IJ SOLN
0.5000 mg | INTRAMUSCULAR | Status: DC | PRN
Start: 2021-08-19 — End: 2021-08-20
  Administered 2021-08-19 – 2021-08-20 (×6): 0.5 mg via INTRAVENOUS
  Filled 2021-08-19 (×6): qty 0.5

## 2021-08-19 MED ORDER — ONDANSETRON HCL 4 MG/2ML IJ SOLN
4.0000 mg | Freq: Four times a day (QID) | INTRAMUSCULAR | Status: DC | PRN
  Administered 2021-08-19 – 2021-08-20 (×2): 4 mg via INTRAVENOUS
  Filled 2021-08-19 (×2): qty 2

## 2021-08-19 MED ORDER — PROPOFOL 10 MG/ML IV BOLUS
INTRAVENOUS | Status: AC
  Filled 2021-08-19: qty 20

## 2021-08-19 MED ORDER — ESCITALOPRAM OXALATE 10 MG PO TABS
5.0000 mg | ORAL_TABLET | Freq: Every day | ORAL | Status: DC
  Administered 2021-08-19 (×2): 5 mg via ORAL
  Filled 2021-08-19 (×2): qty 1

## 2021-08-19 MED ORDER — MORPHINE SULFATE (PF) 2 MG/ML IV SOLN
1.0000 mg | INTRAVENOUS | Status: DC | PRN
  Administered 2021-08-19: 1 mg via INTRAVENOUS
  Filled 2021-08-19: qty 1

## 2021-08-19 MED ORDER — PROPOFOL 10 MG/ML IV BOLUS
INTRAVENOUS | Status: DC | PRN
  Administered 2021-08-19: 80 mg via INTRAVENOUS

## 2021-08-19 MED ORDER — PHENYLEPHRINE HCL (PRESSORS) 10 MG/ML IV SOLN
INTRAVENOUS | Status: DC | PRN
  Administered 2021-08-19 (×2): 80 ug via INTRAVENOUS

## 2021-08-19 MED ORDER — DEXMEDETOMIDINE (PRECEDEX) IN NS 20 MCG/5ML (4 MCG/ML) IV SYRINGE
PREFILLED_SYRINGE | INTRAVENOUS | Status: DC | PRN
  Administered 2021-08-19: 12 ug via INTRAVENOUS

## 2021-08-19 MED ORDER — PANTOPRAZOLE SODIUM 40 MG IV SOLR
40.0000 mg | Freq: Every day | INTRAVENOUS | Status: DC
  Administered 2021-08-19: 40 mg via INTRAVENOUS
  Filled 2021-08-19: qty 10

## 2021-08-19 MED ORDER — PANTOPRAZOLE SODIUM 40 MG IV SOLR
40.0000 mg | Freq: Two times a day (BID) | INTRAVENOUS | Status: DC
Start: 2021-08-19 — End: 2021-08-20
  Administered 2021-08-19 – 2021-08-20 (×3): 40 mg via INTRAVENOUS
  Filled 2021-08-19 (×3): qty 10

## 2021-08-19 MED ORDER — SUCRALFATE 1 GM/10ML PO SUSP
1.0000 g | Freq: Three times a day (TID) | ORAL | Status: DC
Start: 2021-08-19 — End: 2021-08-20
  Administered 2021-08-19 – 2021-08-20 (×3): 1 g via ORAL
  Filled 2021-08-19 (×3): qty 10

## 2021-08-19 MED ORDER — LIDOCAINE 2% (20 MG/ML) 5 ML SYRINGE
INTRAMUSCULAR | Status: DC | PRN
Start: 2021-08-19 — End: 2021-08-19
  Administered 2021-08-19: 100 mg via INTRAVENOUS

## 2021-08-19 NOTE — Anesthesia Postprocedure Evaluation (Signed)
Anesthesia Post Note ? ?Patient: Crystal Pearson ? ?Procedure(s) Performed: ESOPHAGOGASTRODUODENOSCOPY (EGD) ?BIOPSY ? ?  ? ?Patient location during evaluation: PACU ?Anesthesia Type: MAC ?Level of consciousness: awake and alert ?Pain management: pain level controlled ?Vital Signs Assessment: post-procedure vital signs reviewed and stable ?Respiratory status: spontaneous breathing, nonlabored ventilation and respiratory function stable ?Cardiovascular status: blood pressure returned to baseline ?Postop Assessment: no apparent nausea or vomiting ?Anesthetic complications: no ? ? ?No notable events documented. ? ?Last Vitals:  ?Vitals:  ? 08/19/21 1411 08/19/21 1419  ?BP: (!) 97/42 (!) 105/50  ?Pulse: 72 76  ?Resp: 12 13  ?Temp:    ?SpO2: 95% 97%  ?  ?Last Pain:  ?Vitals:  ? 08/19/21 1126  ?TempSrc: Oral  ?PainSc:   ? ? ?  ?  ?  ?  ?  ?  ? ?Crystal Pearson ? ? ? ? ?

## 2021-08-19 NOTE — Assessment & Plan Note (Addendum)
Atypical chest pain.  Reproducible to palpation.  She could also have some pain from the hiatal hernia and dysphagia as well.  EGD as above.  Serial troponin and EKG negative. ?-Discharged on PPI for dysphagia ?-Tylenol as needed for pain ?

## 2021-08-19 NOTE — Brief Op Note (Signed)
08/18/2021 - 08/19/2021 ? ?1:58 PM ? ?PATIENT:  Crystal Pearson  41 y.o. female ? ?PRE-OPERATIVE DIAGNOSIS:  dysphagia ? ?POST-OPERATIVE DIAGNOSIS:  ulcer, HH, see MD note ? ?PROCEDURE:  Procedure(s) with comments: ?ESOPHAGOGASTRODUODENOSCOPY (EGD) (N/A) - possible w/ dilation ?BIOPSY ? ?SURGEON:  Surgeon(s) and Role: ?   * Otis Brace, MD - Primary ? ?Findings ?------------ ?-EGD showed LA grade C esophagitis and distal esophageal ulcers.  Medium size hiatal hernia.  Few tiny gastric ulcers.  Biopsies taken.  No evidence of food bolus. ? ?Recommendations ?------------------------ ?-Soft diet ?-Protonix 40 mg twice a day until repeat EGD ?-Recommend repeat EGD in 2 to 3 months to document healing of esophageal ulcer and for possible dilation. ?-Follow-up in GI clinic in 2 months ?-Discharge from GI standpoint.  GI will sign off.  Call us back if needed ? ?Otis Brace MD, FACP ?08/19/2021, 2:00 PM ? ?Contact #  [phone]  ? ?

## 2021-08-19 NOTE — Assessment & Plan Note (Addendum)
Normotensive for most part. ?

## 2021-08-19 NOTE — H&P (Signed)
?History and Physical  ? ? ?Patient: Crystal Pearson XBW:[id number] DOB: Jun 12, 1980 ?DOA: 08/18/2021 ?DOS: the patient was seen and examined on 08/19/2021 ?PCP: Carol Ada, MD  ?Patient coming from: Home ? ?Chief Complaint:  ?Chief Complaint  ?Patient presents with  ? Chest Pain  ? ?HPI: Crystal Pearson is a 41 y.o. female with medical history significant of Raynaud's disease, obesity who presents with concerns of chest pain and dysphagia. ? ?She reports that about 2 weeks ago she started to have intermittent heartburn and has been taking Tums with no relief.  Over the weekend about 3 days ago she had a steak and baked potato and had sensation of it getting stuck.  Had severe burning midsternal chest pain that brought her to tears.  Since then she continued to have burning pain each time she tries to eat solid or drink.  Has been having nausea and vomiting with both solids and liquids.  She denies history of GERD or gastric ulcers.  Has occasional NSAID use.  No alcohol use.  No lower abdominal pain.  ? ?In the ED, she was afebrile mildly hypertensive with SBP of 150s on room air.  CBC and BMP unremarkable.  GI Dr. Watt Climes initially advised that patient be discharged with outpatient follow-up but patient had persistent nausea and vomiting and was advised to admit for observation and potential EGD versus barium swallow in the morning. ? ? ?Review of Systems: As mentioned in the history of present illness. All other systems reviewed and are negative. ?Past Medical History:  ?Diagnosis Date  ? Chest pain   ? Endometriosis   ? Family history of adverse reaction to anesthesia   ? Family history of heart disease   ? History of kidney stones 01/2021  ? Scoliosis   ? ?Past Surgical History:  ?Procedure Laterality Date  ? ABDOMINAL HYSTERECTOMY    ? APPENDECTOMY    ? BACK SURGERY    ? Harrington Rod placement  ? CESAREAN SECTION    ? x2  ? CYSTOCELE REPAIR N/A 06/26/2021  ? Procedure: ANTERIOR REPAIR (CYSTOCELE);  Surgeon: Gae Dry, MD;  Location: [facility];  Service: Gynecology;  Laterality: N/A;  ? CYSTOSCOPY N/A 06/26/2021  ? Procedure: CYSTOSCOPY;  Surgeon: Gae Dry, MD;  Location: [facility];  Service: Gynecology;  Laterality: N/A;  ? endometrial surgery    ? FOOT SURGERY    ? KNEE SURGERY    ? LAPAROSCOPY N/A 07/13/2021  ? Procedure: LAPAROSCOPY DIAGNOSTIC;  Surgeon: Gae Dry, MD;  Location: [facility];  Service: Gynecology;  Laterality: N/A;  ? OOPHORECTOMY    ? ?Social History:  reports that she has never smoked. She has never used smokeless tobacco. She reports that she does not drink alcohol and does not use drugs. ? ?No Known Allergies ? ?Family History  ?Problem Relation Age of Onset  ? Hypertension Mother   ? Cancer Father   ?     bladder  ? Heart disease Father   ? Breast cancer Neg Hx   ? ? ?Prior to Admission medications   ?Medication Sig Start Date End Date Taking? Authorizing Provider  ?acetaminophen (TYLENOL) 500 MG tablet Take 500-1,000 mg by mouth every 6 (six) hours as needed for mild pain or headache.   Yes [provider]  ?escitalopram (LEXAPRO) 5 MG tablet Take 5 mg by mouth at bedtime. 07/07/21  Yes [provider]  ?ibuprofen (ADVIL) 200 MG tablet Take 200-600 mg by mouth every 6 (  six) hours as needed for mild pain or headache.   Yes [provider]  ?naproxen (NAPROSYN) 500 MG tablet Take 500 mg by mouth 2 (two) times daily as needed for pain. 06/16/21  Yes [provider]  ?metroNIDAZOLE (FLAGYL) 500 MG tablet Take 1 tablet (500 mg total) by mouth 2 (two) times daily. ?Patient not taking: Reported on 08/18/2021 08/12/21   Gae Dry, MD  ?FLUoxetine (PROZAC) 10 MG capsule  07/06/18 07/23/19  [provider]  ? ? ?Physical Exam: ?Vitals:  ? 08/18/21 2145 08/18/21 2216 08/18/21 2254 08/19/21 0006  ?BP:  (!) 151/92 (!) 145/88   ?Pulse: 68 75 79   ?Resp: '16 18 18   '$ ?Temp:  97.9 ?F (36.6 ?C) 98.4 ?F (36.9 ?C)   ?TempSrc:   Oral   ?SpO2: 96% 97% 97%    ?Weight:    91.5 kg  ?Height:    '5\' 5"'$  (1.651 m)  ? ?Constitutional: NAD, calm, comfortable, obese female sitting upright in bed ?Eyes: lids and conjunctivae normal ?ENMT: Mucous membranes are moist. Posterior pharynx clear of any exudate or lesions.Normal dentition.  ?Neck: normal, supple ?Respiratory: clear to auscultation bilaterally, no wheezing, no crackles. Normal respiratory effort.  ?Cardiovascular: Regular rate and rhythm, no murmurs / rubs / gallops. No extremity edema.  ?Abdomen: Soft, nondistended no tenderness, Bowel sounds positive.  ?Musculoskeletal: no clubbing / cyanosis. No joint deformity upper and lower extremities. Good ROM, no contractures. Normal muscle tone.  ?Skin: Peeling of skin on the anterior chest and bilateral upper extremity with faint erythema throughout from sunburn ?Neurologic: CN 2-12 grossly intact. Strength 5/5 in all 4.  ?Psychiatric: Normal judgment and insight. Alert and oriented x 3. Normal mood. ?Data Reviewed: ? ?See HPI ? ?Assessment and Plan: ?Chest pain ?Presented with globus sensation and midsternal burning chest pain with nausea and vomiting. ?-Differential includes dyspepsia versus esophageal dysmotility ?-Received IM glucagon, Mylanta in the ED without relief ?-Give IV PPI qHS ?-prn IV morphine for pain ?-[facility] to consult in the morning for potential EGD vs Barium swallow. Keep NPO past midnight ? ?Elevated BP without diagnosis of hypertension ?Due to pain. Tx as above. ? ?Obesity (BMI 30.0-34.9) ?BMI of 33 ? ? ? ? ? Advance Care Planning:   Code Status: Full Code  ? ?Consults: [facility] ? ?Family Communication: husband at bedside ? ?Severity of Illness: ?The appropriate patient status for this patient is OBSERVATION. Observation status is judged to be reasonable and necessary in order to provide the required intensity of service to ensure the patient's safety. The patient's presenting symptoms, physical exam findings, and initial radiographic and laboratory data in  the context of their medical condition is felt to place them at decreased risk for further clinical deterioration. Furthermore, it is anticipated that the patient will be medically stable for discharge from the hospital within 2 midnights of admission.  ? ?Author: Orene Desanctis, DO ?08/19/2021 12:29 AM ? ?For on call review [email or web address].  ?

## 2021-08-19 NOTE — Assessment & Plan Note (Signed)
BMI of 33 ?

## 2021-08-19 NOTE — Consult Note (Addendum)
Referring Provider: TRH ?Primary Care Physician:  Carol Ada, MD ?Primary Gastroenterologist: Althia Forts  ? ?Reason for Consultation:  chest pain, dysphagia ? ?HPI: Crystal Pearson is a 41 y.o. female medical history significant of Raynaud's disease, obesity who presents with concerns of chest pain and dysphagia. ? ?She reports 3 week history of heartburn she attempted to managed with Tums with minimal relief. She reports Saturday she was eating steak and baked potatoes when she felt the food "get stuck" in her mid-chest. This caused severe chest pain. She had 4 episodes of emesis that contained previously ingested food, no blood, on the way home from the restaurant.  Since Saturday she will not have nausea and vomiting anytime she attempts to ingest solids or liquids.   ? ?Notes a new baseline chest pain since Saturday that is moderate.  Notes the chest pain becomes severe anytime she tries to eat or drink.  Vomiting also increases her pain.  She attempted to eat crackers and drink small amounts of water which resulted in vomiting at home.  At presentation to the ED she attempted to drink ginger ale which also caused increased chest pain and vomiting. ? ?She notes she has had intermittent mild chest pains with eating for over a year.  She has not previously had severe chest pain like this or felt food get stuck in her chest.  ? ?No previous colonoscopy or EGD.  No family history of colon cancer.  Denies NSAID, alcohol, smoking.  Denies blood thinner use. ?Notes her mother had a history of esophageal dilations. ? ?Past Medical History:  ?Diagnosis Date  ? Chest pain   ? Endometriosis   ? Family history of adverse reaction to anesthesia   ? Family history of heart disease   ? History of kidney stones 01/2021  ? Scoliosis   ? ? ?Past Surgical History:  ?Procedure Laterality Date  ? ABDOMINAL HYSTERECTOMY    ? APPENDECTOMY    ? BACK SURGERY    ? Harrington Rod placement  ? CESAREAN SECTION    ? x2  ? CYSTOCELE REPAIR  N/A 06/26/2021  ? Procedure: ANTERIOR REPAIR (CYSTOCELE);  Surgeon: Gae Dry, MD;  Location: [facility];  Service: Gynecology;  Laterality: N/A;  ? CYSTOSCOPY N/A 06/26/2021  ? Procedure: CYSTOSCOPY;  Surgeon: Gae Dry, MD;  Location: [facility];  Service: Gynecology;  Laterality: N/A;  ? endometrial surgery    ? FOOT SURGERY    ? KNEE SURGERY    ? LAPAROSCOPY N/A 07/13/2021  ? Procedure: LAPAROSCOPY DIAGNOSTIC;  Surgeon: Gae Dry, MD;  Location: [facility];  Service: Gynecology;  Laterality: N/A;  ? OOPHORECTOMY    ? ? ?Prior to Admission medications   ?Medication Sig Start Date End Date Taking? Authorizing Provider  ?acetaminophen (TYLENOL) 500 MG tablet Take 500-1,000 mg by mouth every 6 (six) hours as needed for mild pain or headache.   Yes [provider]  ?escitalopram (LEXAPRO) 5 MG tablet Take 5 mg by mouth at bedtime. 07/07/21  Yes [provider]  ?ibuprofen (ADVIL) 200 MG tablet Take 200-600 mg by mouth every 6 (six) hours as needed for mild pain or headache.   Yes [provider]  ?naproxen (NAPROSYN) 500 MG tablet Take 500 mg by mouth 2 (two) times daily as needed for pain. 06/16/21  Yes [provider]  ?metroNIDAZOLE (FLAGYL) 500 MG tablet Take 1 tablet (500 mg total) by mouth 2 (two) times daily. ?Patient not taking: Reported on 08/18/2021 08/12/21  Gae Dry, MD  ?FLUoxetine (PROZAC) 10 MG capsule  07/06/18 07/23/19  [provider]  ? ? ?Scheduled Meds: ? escitalopram  5 mg Oral QHS  ? pantoprazole (PROTONIX) IV  40 mg Intravenous QHS  ? ?Continuous Infusions: ? sodium chloride 75 mL/hr at 08/19/21 0048  ? ?PRN Meds:.HYDROmorphone (DILAUDID) injection, morphine injection, ondansetron (ZOFRAN) IV ? ?Allergies as of 08/18/2021  ? (No Known Allergies)  ? ? ?Family History  ?Problem Relation Age of Onset  ? Hypertension Mother   ? Cancer Father   ?     bladder  ? Heart disease Father   ? Breast cancer Neg Hx   ? ? ?Social History   ? ?Socioeconomic History  ? Marital status: Married  ?  Spouse name: Not on file  ? Number of children: Not on file  ? Years of education: Not on file  ? Highest education level: Not on file  ?Occupational History  ? Not on file  ?Tobacco Use  ? Smoking status: Never  ? Smokeless tobacco: Never  ?Vaping Use  ? Vaping Use: Never used  ?Substance and Sexual Activity  ? Alcohol use: No  ? Drug use: No  ? Sexual activity: Yes  ?  Birth control/protection: Surgical  ?  Comment: Hysterectomy  ?Other Topics Concern  ? Not on file  ?Social History Narrative  ? Not on file  ? ?Social Determinants of Health  ? ?Financial Resource Strain: Not on file  ?Food Insecurity: Not on file  ?Transportation Needs: Not on file  ?Physical Activity: Not on file  ?Stress: Not on file  ?Social Connections: Not on file  ?Intimate Partner Violence: Not on file  ? ? ?Review of Systems:Review of Systems  ?Constitutional:  Negative for chills and fever.  ?HENT:  Negative for hearing loss and tinnitus.   ?Eyes:  Negative for blurred vision and double vision.  ?Respiratory:  Negative for cough and sputum production.   ?Cardiovascular:  Positive for chest pain. Negative for palpitations.  ?Gastrointestinal:  Positive for heartburn, nausea and vomiting. Negative for abdominal pain, blood in stool, constipation, diarrhea and melena.  ?Genitourinary:  Negative for dysuria and urgency.  ?Musculoskeletal:  Negative for myalgias and neck pain.  ?Skin:  Negative for itching and rash.  ?Neurological:  Negative for dizziness and headaches.  ?Endo/Heme/Allergies:  Negative for environmental allergies. Does not bruise/bleed easily.  ?Psychiatric/Behavioral:  Negative for depression and substance abuse.   ? ? ?Physical Exam:Physical Exam ?Constitutional:   ?   General: She is not in acute distress. ?   Appearance: Normal appearance. She is normal weight.  ?HENT:  ?   Head: Normocephalic and atraumatic.  ?   Right Ear: External ear normal.  ?   Left Ear:  External ear normal.  ?   Nose: Nose normal.  ?   Mouth/Throat:  ?   Mouth: Mucous membranes are moist.  ?Eyes:  ?   Conjunctiva/sclera: Conjunctivae normal.  ?   Pupils: Pupils are equal, round, and reactive to light.  ?Cardiovascular:  ?   Rate and Rhythm: Normal rate and regular rhythm.  ?   Pulses: Normal pulses.  ?   Heart sounds: Normal heart sounds.  ?Pulmonary:  ?   Effort: Pulmonary effort is normal.  ?   Breath sounds: Normal breath sounds.  ?Abdominal:  ?   General: Abdomen is flat. Bowel sounds are normal. There is no distension.  ?   Palpations: Abdomen is soft. There is no mass.  ?  Tenderness: There is no abdominal tenderness. There is no guarding or rebound.  ?   Hernia: No hernia is present.  ?Musculoskeletal:     ?   General: Normal range of motion.  ?   Cervical back: Normal range of motion and neck supple.  ?Skin: ?   General: Skin is warm and dry.  ?Neurological:  ?   General: No focal deficit present.  ?   Mental Status: She is alert and oriented to person, place, and time.  ?Psychiatric:     ?   Mood and Affect: Mood normal.     ?   Behavior: Behavior normal.  ?  ?Vital signs: ?Vitals:  ? 08/18/21 2254 08/19/21 0418  ?BP: (!) 145/88 107/74  ?Pulse: 79 78  ?Resp: 18 18  ?Temp: 98.4 ?F (36.9 ?C) 98 ?F (36.7 ?C)  ?SpO2: 97% 95%  ? ?Last BM Date : 08/18/21 ? ? ? ?GI:  ?Lab Results: ?Recent Labs  ?  08/18/21 ?1450  ?WBC 10.0  ?HGB 13.1  ?HCT 41.4  ?PLT 347  ? ?BMET ?Recent Labs  ?  08/18/21 ?1450 08/19/21 ?0449  ?NA 139 138  ?K 3.8 3.8  ?CL 104 105  ?CO2 27 26  ?GLUCOSE 104* 92  ?BUN 10 9  ?CREATININE 0.74 0.80  ?CALCIUM 9.8 8.9  ? ?LFT ?No results for input(s): PROT, ALBUMIN, AST, ALT, ALKPHOS, BILITOT, BILIDIR, IBILI in the last 72 hours. ?PT/INR ?No results for input(s): LABPROT, INR in the last 72 hours. ? ? ?Studies/Results: ?DG Chest 2 View ? ?Result Date: 08/18/2021 ?CLINICAL DATA:  Chest pain EXAM: CHEST - 2 VIEW COMPARISON:  Chest x-ray dated February 20, 2021 FINDINGS: The heart size and  mediastinal contours are within normal limits. Both lungs are clear. The visualized skeletal structures are unremarkable. Thoracolumbar spine hardware noted. IMPRESSION: No active cardiopulmonary disease. Electronically

## 2021-08-19 NOTE — Anesthesia Preprocedure Evaluation (Addendum)
Anesthesia Evaluation  ?Patient identified by MRN, date of birth, ID band ?Patient awake ? ? ? ?Reviewed: ?Allergy & Precautions, NPO status , Patient's Chart, lab work & pertinent test results ? ?History of Anesthesia Complications ?Negative for: history of anesthetic complications ? ?Airway ?Mallampati: II ? ?TM Distance: >3 FB ?Neck ROM: Full ? ? ? Dental ?no notable dental hx. ? ?  ?Pulmonary ?neg pulmonary ROS,  ?  ?Pulmonary exam normal ? ? ? ? ? ? ? Cardiovascular ?negative cardio ROS ?Normal cardiovascular exam ? ? ?  ?Neuro/Psych ?negative neurological ROS ? negative psych ROS  ? GI/Hepatic ?negative GI ROS, Neg liver ROS,   ?Endo/Other  ?BMI 34 ? Renal/GU ?negative Renal ROS  ?negative genitourinary ?  ?Musculoskeletal ? ?(+) Arthritis ,  ? Abdominal ?  ?Peds ? Hematology ?negative hematology ROS ?(+)   ?Anesthesia Other Findings ?Day of surgery medications reviewed with patient. ? Reproductive/Obstetrics ?negative OB ROS ? ?  ? ? ? ? ? ? ? ? ? ? ? ? ? ?  ?  ? ? ? ? ? ? ? ?Anesthesia Physical ?Anesthesia Plan ? ?ASA: 2 ? ?Anesthesia Plan: MAC  ? ?Post-op Pain Management: Minimal or no pain anticipated  ? ?Induction:  ? ?PONV Risk Score and Plan: Propofol infusion and Treatment may vary due to age or medical condition ? ?Airway Management Planned: Natural Airway and Nasal Cannula ? ?Additional Equipment: None ? ?Intra-op Plan:  ? ?Post-operative Plan:  ? ?Informed Consent: I have reviewed the patients History and Physical, chart, labs and discussed the procedure including the risks, benefits and alternatives for the proposed anesthesia with the patient or authorized representative who has indicated his/her understanding and acceptance.  ? ? ? ? ? ?Plan Discussed with: CRNA ? ?Anesthesia Plan Comments:   ? ? ? ? ? ?Anesthesia Quick Evaluation ? ?

## 2021-08-19 NOTE — Transfer of Care (Signed)
Immediate Anesthesia Transfer of Care Note ? ?Patient: Crystal Pearson ? ?Procedure(s) Performed: ESOPHAGOGASTRODUODENOSCOPY (EGD) ?BIOPSY ? ?Patient Location: Endoscopy Unit ? ?Anesthesia Type:General ? ?Level of Consciousness: sedated ? ?Airway & Oxygen Therapy: Patient Spontanous Breathing and Patient connected to face mask oxygen ? ?Post-op Assessment: Report given to RN and Post -op Vital signs reviewed and stable ? ?Post vital signs: Reviewed and stable ? ?Last Vitals:  ?Vitals Value Taken Time  ?BP 104/51 08/19/21 1358  ?Temp    ?Pulse 60 08/19/21 1400  ?Resp 14 08/19/21 1400  ?SpO2 100 % 08/19/21 1400  ?Vitals shown include unvalidated device data. ? ?Last Pain:  ?Vitals:  ? 08/19/21 1126  ?TempSrc: Oral  ?PainSc:   ?   ? ?Patients Stated Pain Goal: 0 (08/19/21 0720) ? ?Complications: No notable events documented. ?

## 2021-08-19 NOTE — Progress Notes (Signed)
Initial Nutrition Assessment ? ?DOCUMENTATION CODES:  ? ?Obesity unspecified ? ?INTERVENTION:  ?- diet advancement as medically feasible. ?- complete NFPE at follow-up. ? ? ?NUTRITION DIAGNOSIS:  ? ?Inadequate oral intake related to inability to eat as evidenced by NPO status. ? ?GOAL:  ? ?Patient will meet greater than or equal to 90% of their needs ? ?MONITOR:  ? ?Diet advancement, Labs, Weight trends ? ?REASON FOR ASSESSMENT:  ? ?Malnutrition Screening Tool ? ?ASSESSMENT:  ? ?41 year old female with medical history of Raynaud's disease, obesity, endometriosis, and scoliosis. She presented to the ED due to persistent N/V and dysphagia. GI was consulted and plan for EGD. She was admitted for chest pain. ? ?Patient has been out of the room to EGD since 1215 today. She has been NPO since admission. ? ?She has not been seen by a [place] RD at any time in the past.  ? ?Weight today is 202 lb and weight has been 202-207 lb over the past 5 months. On 4/18 she weighed 205 lb which was up from 201 lb on 3/29. ? ?Patient is noted to be Observation status. MD note today states patient likely to remain in the hospital overnight to ensure diet tolerance.  ? ? ?Labs reviewed. ?Medications reviewed; 40 mg IV protonix BID. ?  ? ?NUTRITION - FOCUSED PHYSICAL EXAM: ? ?Patient out of the room ? ?Diet Order:   ?Diet Order   ? ?       ?  Diet NPO time specified Except for: Sips with Meds, Ice Chips  Diet effective now       ?  ? ?  ?  ? ?  ? ? ?EDUCATION NEEDS:  ? ?Not appropriate for education at this time ? ?Skin:  Skin Assessment: Reviewed RN Assessment ? ?Last BM:  PTA/unknown ? ?Height:  ? ?Ht Readings from Last 1 Encounters:  ?08/19/21 '5\' 5"'$  (1.651 m)  ? ? ?Weight:  ? ?Wt Readings from Last 1 Encounters:  ?08/19/21 91.5 kg  ? ? ? ?BMI:  Body mass index is 33.57 kg/m?. ? ?Estimated Nutritional Needs:  ?Kcal:  1800-2000 kcal ?Protein:  80-90 grams ?Fluid:  >/= 2 L/day ? ? ? ? ?Jarome Matin, MS, RD, LDN ?Registered Dietitian  II ?Inpatient Clinical Nutrition ?RD pager # and on-call/weekend pager # available in [place]  ? ?

## 2021-08-19 NOTE — TOC Initial Note (Signed)
Transition of Care (TOC) - Initial/Assessment Note  ? ? ?Patient Details  ?Name: Crystal Pearson ?MRN: [id number] ?Date of Birth: [birth year] ? ?Transition of Care [facility]) CM/SW Contact:    ?Leeroy Cha, RN ?Phone Number: ?08/19/2021, 11:05 AM ? ?Clinical Narrative:                 ? ?Transition of Care (TOC) Screening Note ? ? ?Patient Details  ?Name: Crystal Pearson ?Date of Birth: December 09, 1980 ? ? ?Transition of Care [facility]) CM/SW Contact:    ?Leeroy Cha, RN ?Phone Number: ?08/19/2021, 11:05 AM ? ? ? ?Transition of Care Department [facility]) has reviewed patient and no TOC needs have been identified at this time. We will continue to monitor patient advancement through interdisciplinary progression rounds. If new patient transition needs arise, please place a TOC consult. ? ? ? ?Expected Discharge Plan: Home/Self Care ?Barriers to Discharge: Continued Medical Work up ? ? ?Patient Goals and CMS Choice ?Patient states their goals for this hospitalization and ongoing recovery are:: to go home ?CMS Medicare.gov Compare Post Acute Care list provided to:: Patient ?Choice offered to / list presented to : Patient ? ?Expected Discharge Plan and Services ?Expected Discharge Plan: Home/Self Care ?  ?Discharge Planning Services: CM Consult ?  ?Living arrangements for the past 2 months: [place] ?                ?  ?  ?  ?  ?  ?  ?  ?  ?  ?  ? ?Prior Living Arrangements/Services ?Living arrangements for the past 2 months: [place] ?Lives with:: Spouse ?  ?Do you feel safe going back to the place where you live?: Yes      ?  ?  ?  ?Criminal Activity/Legal Involvement Pertinent to Current Situation/Hospitalization: No - Comment as needed ? ?Activities of Daily Living ?Home Assistive Devices/Equipment: Contact lenses ?ADL Screening (condition at time of admission) ?Patient's cognitive ability adequate to safely complete daily activities?: Yes ?Is the patient deaf or have difficulty hearing?: No ?Does the patient  have difficulty seeing, even when wearing glasses/contacts?: No ?Does the patient have difficulty concentrating, remembering, or making decisions?: No ?Patient able to express need for assistance with ADLs?: Yes ?Does the patient have difficulty dressing or bathing?: No ?Independently performs ADLs?: Yes (appropriate for developmental age) ?Does the patient have difficulty walking or climbing stairs?: No ?Weakness of Legs: Both ?Weakness of Arms/Hands: Both ? ?Permission Sought/Granted ?  ?  ?   ?   ?   ?   ? ?Emotional Assessment ?Appearance:: Appears stated age ?  ?Affect (typically observed): Accepting ?Orientation: : Oriented to Self, Oriented to Place, Oriented to  Time, Oriented to Situation ?Alcohol / Substance Use: Not Applicable ?Psych Involvement: No (comment) ? ?Admission diagnosis:  Chest pain [R07.9] ?Chest pain, unspecified type [R07.9] ?Patient Active Problem List  ? Diagnosis Date Noted  ? Elevated BP without diagnosis of hypertension 08/19/2021  ? Chest pain 08/18/2021  ? RLQ abdominal pain 07/13/2021  ? Right lower quadrant pain 07/13/2021  ? S/P anterior colporrhaphy 07/02/2021  ? Primary stress urinary incontinence 06/26/2021  ? Cystocele, midline 06/26/2021  ? Obesity (BMI 30.0-34.9) 07/11/2018  ? 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  ? Scoliosis (and kyphoscoliosis), idiopathic 07/27/2012  ? ?PCP:  Carol Ada, MD ?Pharmacy:   ?[id number] [place]?Phone: [phone]: [phone]? ? ? ? ?  Social Determinants of Health (SDOH) Interventions ?  ? ?Readmission Risk Interventions ?   ? View : No data to display.  ?  ?  ?  ? ? ? ?

## 2021-08-19 NOTE — Op Note (Signed)
[facility] ?Patient Name: Crystal Pearson ?Procedure Date: 08/19/2021 ?MRN: [id number] ?Attending MD: Otis Brace , MD ?Date of Birth: Aug 28, 1980 ?CSN: [id number] ?Age: 41 ?Admit Type: Outpatient ?Procedure:                Upper GI endoscopy ?Indications:              Dysphagia ?Providers:                Otis Brace, MD, Gloris Ham,  ?                          Technician, Ervin Knack, RN ?Referring MD:              ?Medicines:                Sedation Administered by an Anesthesia Professional ?Complications:            No immediate complications. ?Estimated Blood Loss:     Estimated blood loss was minimal. ?Procedure:                Pre-Anesthesia Assessment: ?                          - Prior to the procedure, a History and Physical  ?                          was performed, and patient medications and  ?                          allergies were reviewed. The patient's tolerance of  ?                          previous anesthesia was also reviewed. The risks  ?                          and benefits of the procedure and the sedation  ?                          options and risks were discussed with the patient.  ?                          All questions were answered, and informed consent  ?                          was obtained. Prior Anticoagulants: The patient has  ?                          taken no previous anticoagulant or antiplatelet  ?                          agents. ASA Grade Assessment: II - A patient with  ?                          mild systemic disease. After reviewing the risks  ?  and benefits, the patient was deemed in  ?                          satisfactory condition to undergo the procedure. ?                          After obtaining informed consent, the endoscope was  ?                          passed under direct vision. Throughout the  ?                          procedure, the patient's blood pressure, pulse, and  ?                           oxygen saturations were monitored continuously. The  ?                          [id number]-H190 ([id number] Olympus endoscope was introduced  ?                          through the mouth, and advanced to the second part  ?                          of duodenum. The upper GI endoscopy was  ?                          accomplished without difficulty. The patient  ?                          tolerated the procedure well. ?Scope In: ?Scope Out: ?Findings: ?     LA Grade C (one or more mucosal breaks continuous between tops of 2 or  ?     more mucosal folds, less than 75% circumference) esophagitis with no  ?     bleeding was found in the distal esophagus. ?     One superficial esophageal ulcer with no bleeding and no stigmata of  ?     recent bleeding was found at the gastroesophageal junction. The lesion  ?     was 6 mm in largest dimension. ?     A medium-sized hiatal hernia was present. ?     Few non-bleeding linear gastric ulcers with no stigmata of bleeding were  ?     found in the prepyloric region of the stomach. The largest lesion was 4  ?     mm in largest dimension. Biopsies were taken with a cold forceps for  ?     histology. ?     The cardia and gastric fundus were normal on retroflexion. ?     Scattered mild inflammation characterized by erythema was found in the  ?     duodenal bulb. ?     The first portion of the duodenum and second portion of the duodenum  ?     were normal. ?Impression:               - LA Grade C reflux esophagitis with no bleeding. ?                          -  Esophageal ulcer with no bleeding and no stigmata  ?                          of recent bleeding. ?                          - Medium-sized hiatal hernia. ?                          - Non-bleeding gastric ulcers with no stigmata of  ?                          bleeding. Biopsied. ?                          - Duodenitis. ?                          - Normal first portion of the duodenum and second  ?                          portion of the  duodenum. ?Moderate Sedation: ?     Moderate (conscious) sedation was personally administered by an  ?     anesthesia professional. The following parameters were monitored: oxygen  ?     saturation, heart rate, blood pressure, and response to care. ?Recommendation:           - Patient has a contact number available for  ?                          emergencies. The signs and symptoms of potential  ?                          delayed complications were discussed with the  ?                          patient. Return to normal activities tomorrow.  ?                          Written discharge instructions were provided to the  ?                          patient. ?                          - Soft diet. ?                          - Continue present medications. ?                          - Await pathology results. ?                          - Repeat upper endoscopy in 3 months to check  ?  healing. ?                          - No ibuprofen, naproxen, or other non-steroidal  ?                          anti-inflammatory drugs. ?                          - Return to GI clinic in 2 months. ?Procedure Code(s):        --- Professional --- ?                          [phone], Esophagogastroduodenoscopy, flexible,  ?                          transoral; with biopsy, single or multiple ?Diagnosis Code(s):        --- Professional --- ?                          K21.00, Gastro-esophageal reflux disease with  ?                          esophagitis, without bleeding ?                          K22.10, Ulcer of esophagus without bleeding ?                          K44.9, Diaphragmatic hernia without obstruction or  ?                          gangrene ?                          K25.9, Gastric ulcer, unspecified as acute or  ?                          chronic, without hemorrhage or perforation ?                          K29.80, Duodenitis without bleeding ?                          R13.10, Dysphagia, unspecified ?CPT  copyright 2019 American Medical Association. All rights reserved. ?The codes documented in this report are preliminary and upon coder review may  ?be revised to meet current compliance requirements. ?Otis Brace, MD ?Otis Brace, MD ?08/19/2021 1:57:14 PM ?Number of Addenda: 0 ?

## 2021-08-19 NOTE — Progress Notes (Signed)
?PROGRESS NOTE ? ? ? ?Crystal Pearson  TKW:[id number] DOB: [birth year] DOA: 08/18/2021 ?PCP: Carol Ada, MD  ? ?Brief Narrative: ?41 year old with past medical history significant for Raynaud's disease, obesity, ?Who presents with persistent nausea and vomiting, dysphagia unable to tolerate oral intake.  Presented for further evaluation.  GI has been consulted.  Plan for endoscopy today. ? ? ? ?Assessment & Plan: ?  ?Active Problems: ?  Chest pain ?  Obesity (BMI 30.0-34.9) ?  Elevated BP without diagnosis of hypertension ? ?1-Persistent nausea vomiting, dyspepsia, dysphagia, chest pain  ?-Continue with IV fluids ?-Change IV Protonix to twice daily ?-Endoscopy today. ?-Likely  will remain in the hospital overnight to make sure that she is able to tolerate his diet ?-troponin negative.  ? ?Elevated blood pressure: transient.  ?Blood pressure has normalized ? ?Obesity: will need lifestyle modification ? ? ?Estimated body mass index is 33.57 kg/m? as calculated from the following: ?  Height as of this encounter: '5\' 5"'$  (1.651 m). ?  Weight as of this encounter: 91.5 kg. ? ? ?DVT prophylaxis:  ?Code Status:  ?Family Communication: ?Disposition Plan:  ?Status is: Observation ?The patient remains OBS appropriate and will d/c before 2 midnights. ? ? ? ?Consultants:  ?GI ? ?Procedures:  ?Endoscopy ? ?Antimicrobials:  ? ? ?Subjective: ?She report nausea, vomiting, chest pressure. She feels food gets stuck in her esophagus. Her mother has had dilation of esophagus.  ?She will need letter Katherina Right paper at discharge ?She report  recent surgery for bladder perforation and pelvic hematoma last moth.  ? ?Objective: ?Vitals:  ? 08/19/21 0006 08/19/21 0418 08/19/21 0730 08/19/21 1126  ?BP:  107/74 129/83 110/85  ?Pulse:  78 76 70  ?Resp:  18  18  ?Temp:  98 ?F (36.7 ?C) 97.9 ?F (36.6 ?C) 97.7 ?F (36.5 ?C)  ?TempSrc:  Oral Oral Oral  ?SpO2:  95% 97% 96%  ?Weight: 91.5 kg     ?Height: '5\' 5"'$  (1.651 m)     ? ? ?Intake/Output Summary (Last  24 hours) at 08/19/2021 1223 ?Last data filed at 08/19/2021 0600 ?Gross per 24 hour  ?Intake 419.1 ml  ?Output --  ?Net 419.1 ml  ? ?Filed Weights  ? 08/19/21 0006  ?Weight: 91.5 kg  ? ? ?Examination: ? ?General exam: Appears calm and comfortable  ?Respiratory system: Clear to auscultation. Respiratory effort normal. ?Cardiovascular system: S1 & S2 heard, RRR. No JVD, murmurs, rubs, gallops or clicks. No pedal edema. ?Gastrointestinal system: Abdomen is nondistended, soft and nontender. No organomegaly or masses felt. Normal bowel sounds heard. ?Central nervous system: Alert and oriented. No focal neurological deficits. ?Extremities: Symmetric 5 x 5 power. ? ? ?Data Reviewed: I have personally reviewed following labs and imaging studies ? ?CBC: ?Recent Labs  ?Lab 08/18/21 ?1450  ?WBC 10.0  ?HGB 13.1  ?HCT 41.4  ?MCV 88.3  ?PLT 347  ? ?Basic Metabolic Panel: ?Recent Labs  ?Lab 08/18/21 ?1450 08/19/21 ?0449  ?NA 139 138  ?K 3.8 3.8  ?CL 104 105  ?CO2 27 26  ?GLUCOSE 104* 92  ?BUN 10 9  ?CREATININE 0.74 0.80  ?CALCIUM 9.8 8.9  ? ?GFR: ?Estimated Creatinine Clearance: 101.3 mL/min (by C-G formula based on SCr of 0.8 mg/dL). ?Liver Function Tests: ?No results for input(s): AST, ALT, ALKPHOS, BILITOT, PROT, ALBUMIN in the last 168 hours. ?No results for input(s): LIPASE, AMYLASE in the last 168 hours. ?No results for input(s): AMMONIA in the last 168 hours. ?Coagulation Profile: ?No results for input(s): INR,  PROTIME in the last 168 hours. ?Cardiac Enzymes: ?No results for input(s): CKTOTAL, CKMB, CKMBINDEX, TROPONINI in the last 168 hours. ?BNP (last 3 results) ?No results for input(s): PROBNP in the last 8760 hours. ?HbA1C: ?No results for input(s): HGBA1C in the last 72 hours. ?CBG: ?No results for input(s): GLUCAP in the last 168 hours. ?Lipid Profile: ?No results for input(s): CHOL, HDL, LDLCALC, TRIG, CHOLHDL, LDLDIRECT in the last 72 hours. ?Thyroid Function Tests: ?No results for input(s): TSH, T4TOTAL, FREET4,  T3FREE, THYROIDAB in the last 72 hours. ?Anemia Panel: ?No results for input(s): VITAMINB12, FOLATE, FERRITIN, TIBC, IRON, RETICCTPCT in the last 72 hours. ?Sepsis Labs: ?No results for input(s): PROCALCITON, LATICACIDVEN in the last 168 hours. ? ?No results found for this or any previous visit (from the past 240 hour(s)).  ? ? ? ? ? ?Radiology Studies: ?DG Chest 2 View ? ?Result Date: 08/18/2021 ?CLINICAL DATA:  Chest pain EXAM: CHEST - 2 VIEW COMPARISON:  Chest x-ray dated February 20, 2021 FINDINGS: The heart size and mediastinal contours are within normal limits. Both lungs are clear. The visualized skeletal structures are unremarkable. Thoracolumbar spine hardware noted. IMPRESSION: No active cardiopulmonary disease. Electronically Signed   By: Yetta Glassman M.D.   On: 08/18/2021 13:43   ? ? ? ? ? ?Scheduled Meds: ? [MAR Hold] escitalopram  5 mg Oral QHS  ? [MAR Hold] pantoprazole (PROTONIX) IV  40 mg Intravenous Q12H  ? ?Continuous Infusions: ? sodium chloride 75 mL/hr at 08/19/21 1222  ? sodium chloride    ? ? ? LOS: 0 days  ? ? ?Time spent: 35 minutes.  ? ? ? ?Elmarie Shiley, MD ?[facility] ? ? ?If 7PM-7AM, please contact night-coverage ?[email or web address] ? ?08/19/2021, 12:23 PM   ?

## 2021-08-20 ENCOUNTER — Encounter (HOSPITAL_COMMUNITY): Payer: Self-pay | Admitting: Gastroenterology

## 2021-08-20 DIAGNOSIS — K209 Esophagitis, unspecified without bleeding: Secondary | ICD-10-CM

## 2021-08-20 DIAGNOSIS — F39 Unspecified mood [affective] disorder: Secondary | ICD-10-CM

## 2021-08-20 DIAGNOSIS — R1319 Other dysphagia: Secondary | ICD-10-CM | POA: Diagnosis not present

## 2021-08-20 DIAGNOSIS — K259 Gastric ulcer, unspecified as acute or chronic, without hemorrhage or perforation: Secondary | ICD-10-CM

## 2021-08-20 DIAGNOSIS — K2210 Ulcer of esophagus without bleeding: Secondary | ICD-10-CM

## 2021-08-20 DIAGNOSIS — R072 Precordial pain: Secondary | ICD-10-CM | POA: Diagnosis not present

## 2021-08-20 DIAGNOSIS — R112 Nausea with vomiting, unspecified: Secondary | ICD-10-CM | POA: Diagnosis not present

## 2021-08-20 DIAGNOSIS — R030 Elevated blood-pressure reading, without diagnosis of hypertension: Secondary | ICD-10-CM | POA: Diagnosis not present

## 2021-08-20 LAB — SURGICAL PATHOLOGY

## 2021-08-20 MED ORDER — ONDANSETRON HCL 4 MG PO TABS: 4.0000 mg | ORAL_TABLET | Freq: Three times a day (TID) | ORAL | 0 refills | Status: DC | PRN

## 2021-08-20 MED ORDER — PANTOPRAZOLE SODIUM 40 MG PO TBEC: 40.0000 mg | DELAYED_RELEASE_TABLET | Freq: Two times a day (BID) | ORAL | 0 refills | Status: DC

## 2021-08-20 MED ORDER — POLYETHYLENE GLYCOL 3350 17 G PO PACK
17.0000 g | PACK | Freq: Two times a day (BID) | ORAL | Status: DC | PRN
Start: 2021-08-20 — End: 2021-08-20
  Administered 2021-08-20: 17 g via ORAL
  Filled 2021-08-20: qty 1

## 2021-08-20 MED ORDER — SENNOSIDES-DOCUSATE SODIUM 8.6-50 MG PO TABS
1.0000 | ORAL_TABLET | Freq: Two times a day (BID) | ORAL | Status: DC | PRN
Start: 2021-08-20 — End: 2021-08-20

## 2021-08-20 NOTE — Progress Notes (Signed)
Patient discharged home.  Discharge instructions explained, patient verbalizes understanding 

## 2021-08-20 NOTE — Discharge Summary (Signed)
? ?Physician Discharge Summary  ?Crystal Pearson BJS:[id number] DOB: 1981/01/02 DOA: 08/18/2021 ? ?PCP: Carol Ada, MD ? ?Admit date: 08/18/2021 ?Discharge date: 08/20/2021 ?Admitted From: Home ?Disposition: Home ?Recommendations for Outpatient Follow-up:  ?Follow ups as below. ?Please obtain CBC/BMP/Mag at follow up ?Please follow up on the following pending results: Biopsy from EGD ? ?Home Health: Not indicated ?Equipment/Devices: Not indicated ? ? ?Discharge Condition: Stable ?CODE STATUS: Full code ? Follow-up Information   ? ? Carol Ada, MD. Schedule an appointment as soon as possible for a visit in 1 week(s).   ?Specialty: Family Medicine ?Contact information: ?[address], [address] ?[place] [address] ?[phone] ? ? ?  ?  ? ?  ?  ? ?  ? ? ?Hospital course ?41 year old with PMH of Raynaud's disease, osteoarthritis, obesity and mild disorder presenting with nausea, vomiting, dysphagia and globus sensation and admitted for the same.  [facility] consulted.  She had an EGD that  showed LA grade C esophagitis and distal esophageal ulcers, medium size hiatal hernia and few tiny gastric ulcers (biopsied).  GI recommended soft diet, p.o. Protonix 40 mg twice daily and repeat EGD in 2 to 3 months to document healing of esophageal ulcer and for possible dilation. ? ?Patient tolerated soft diet with improvement in her nausea and vomiting.  She is discharged to follow-up with PCP and GI.  She was advised to avoid NSAIDs.  ? ?  ? ?See individual problem list below for more on hospital course. ? ?Problems addressed during this hospitalization ?Problem  ?Nausea, vomiting and dysphagia  ?Esophagitis, esophageal ulcer and gastric ulcer  ?Chest Pain  ?Mood Disorder ([facility])  ?Esophageal Ulcer  ?Gastric Ulcer  ?Elevated Bp Without Diagnosis of Hypertension  ?  ?Assessment and Plan: ?* Nausea, vomiting and dysphagia ?Improved.  Tolerated soft diet. ?-P.o. Protonix 40 mg twice daily ?-Zofran as needed ? ?Esophagitis,  esophageal ulcer and gastric ulcer ?Noted on EGD. ?-PPI as above ?-Advised to avoid NSAID. ?-Provided with teaching her resources about nutrition and diet. ? ?Chest pain ?Atypical chest pain.  Reproducible to palpation.  She could also have some pain from the hiatal hernia and dysphagia as well.  EGD as above.  Serial troponin and EKG negative. ?-Discharged on PPI for dysphagia ?-Tylenol as needed for pain ? ?Mood disorder ([place]) ?Continue home Lexapro. ? ?Elevated BP without diagnosis of hypertension ?Normotensive for most part. ? ?Obesity (BMI 30.0-34.9) ?BMI of 33 ? ? ? ?Vital signs ?Vitals:  ? 08/19/21 1419 08/19/21 1435 08/19/21 2105 08/20/21 0502  ?BP: (!) 105/50 118/82 (!) 141/79 123/89  ?Pulse: 76 63 64 68  ?Temp:  98.7 ?F (37.1 ?C) 98.3 ?F (36.8 ?C) (!) 97.5 ?F (36.4 ?C)  ?Resp: '13 18 18 18  '$ ?Height:      ?Weight:      ?SpO2: 97% 99% 98% 99%  ?TempSrc:  Oral Oral Oral  ?BMI (Calculated):      ?  ? ?Discharge exam ? ?GENERAL: No apparent distress.  Nontoxic. ?HEENT: MMM.  Vision and hearing grossly intact.  ?NECK: Supple.  No apparent JVD.  ?RESP:  No IWOB.  Fair aeration bilaterally. ?CVS:  RRR. Heart sounds normal.  ?ABD/GI/GU: BS+. Abd soft, NTND.  ?MSK/EXT:  Moves extremities. No apparent deformity. No edema.  Tenderness with palpation over lower sternum. ?SKIN: no apparent skin lesion or wound ?NEURO: Awake and alert. Oriented appropriately.  No apparent focal neuro deficit. ?PSYCH: Calm. Normal affect.  ? ?Discharge Instructions ?Discharge Instructions   ? ?  Call MD for:  extreme fatigue   Complete by: As directed ?  ? Call MD for:  persistant dizziness or light-headedness   Complete by: As directed ?  ? Call MD for:  persistant nausea and vomiting   Complete by: As directed ?  ? Call MD for:  severe uncontrolled pain   Complete by: As directed ?  ? Diet general   Complete by: As directed ?  ? Discharge instructions   Complete by: As directed ?  ? It has been a pleasure taking care of you! ? ?You were  hospitalized due to nausea, vomiting, difficulty swallowing and chest discomfort.  Your endoscopy shows inflammation/irritation of the esophagus and stomach.  We have started you on Protonix (acid reflux medication) per recommendation by gastroenterology.  We strongly recommend avoiding any over-the-counter pain medication other than plain Tylenol.  It is very important that you take your medications as prescribed as well.  Your gastroenterologist will arrange outpatient follow-up in 2 to 3 months for repeat endoscopy.  ? ? ?Take care,  ? Increase activity slowly   Complete by: As directed ?  ? ?  ? ?Allergies as of 08/20/2021   ?No Known Allergies ?  ? ?  ?Medication List  ?  ? ?STOP taking these medications   ? ?ibuprofen 200 MG tablet ?Commonly known as: ADVIL ?  ?metroNIDAZOLE 500 MG tablet ?Commonly known as: FLAGYL ?  ?naproxen 500 MG tablet ?Commonly known as: NAPROSYN ?  ? ?  ? ?TAKE these medications   ? ?acetaminophen 500 MG tablet ?Commonly known as: TYLENOL ?Take 500-1,000 mg by mouth every 6 (six) hours as needed for mild pain or headache. ?  ?escitalopram 5 MG tablet ?Commonly known as: LEXAPRO ?Take 5 mg by mouth at bedtime. ?  ?ondansetron 4 MG tablet ?Commonly known as: Zofran ?Take 1 tablet (4 mg total) by mouth every 8 (eight) hours as needed for up to 20 doses for nausea or vomiting. ?  ?pantoprazole 40 MG tablet ?Commonly known as: Protonix ?Take 1 tablet (40 mg total) by mouth 2 (two) times daily. ?  ? ?  ? ? ?Consultations: ?Gastroenterology ? ?Procedures/Studies: ?08/19/21--EGD showed LA grade C esophagitis and distal esophageal ulcers.  Medium size hiatal hernia.  Few tiny gastric ulcers.  Biopsies taken.  No evidence of food bolus. ? ? ?DG Chest 2 View ? ?Result Date: 08/18/2021 ?CLINICAL DATA:  Chest pain EXAM: CHEST - 2 VIEW COMPARISON:  Chest x-ray dated February 20, 2021 FINDINGS: The heart size and mediastinal contours are within normal limits. Both lungs are clear. The visualized skeletal  structures are unremarkable. Thoracolumbar spine hardware noted. IMPRESSION: No active cardiopulmonary disease. Electronically Signed   By: Yetta Glassman M.D.   On: 08/18/2021 13:43   ? ? ? ? ?The results of significant diagnostics from this hospitalization (including imaging, microbiology, ancillary and laboratory) are listed below for reference.   ? ? ?Microbiology: ?No results found for this or any previous visit (from the past 240 hour(s)).  ? ?Labs: ? ?CBC: ?Recent Labs  ?Lab 08/18/21 ?1450  ?WBC 10.0  ?HGB 13.1  ?HCT 41.4  ?MCV 88.3  ?PLT 347  ? ?BMP &GFR ?Recent Labs  ?Lab 08/18/21 ?1450 08/19/21 ?0449  ?NA 139 138  ?K 3.8 3.8  ?CL 104 105  ?CO2 27 26  ?GLUCOSE 104* 92  ?BUN 10 9  ?CREATININE 0.74 0.80  ?CALCIUM 9.8 8.9  ? ?Estimated Creatinine Clearance: 101.3 mL/min (by C-G formula based on  SCr of 0.8 mg/dL). ?Liver & Pancreas: ?No results for input(s): AST, ALT, ALKPHOS, BILITOT, PROT, ALBUMIN in the last 168 hours. ?No results for input(s): LIPASE, AMYLASE in the last 168 hours. ?No results for input(s): AMMONIA in the last 168 hours. ?Diabetic: ?No results for input(s): HGBA1C in the last 72 hours. ?No results for input(s): GLUCAP in the last 168 hours. ?Cardiac Enzymes: ?No results for input(s): CKTOTAL, CKMB, CKMBINDEX, TROPONINI in the last 168 hours. ?No results for input(s): PROBNP in the last 8760 hours. ?Coagulation Profile: ?No results for input(s): INR, PROTIME in the last 168 hours. ?Thyroid Function Tests: ?No results for input(s): TSH, T4TOTAL, FREET4, T3FREE, THYROIDAB in the last 72 hours. ?Lipid Profile: ?No results for input(s): CHOL, HDL, LDLCALC, TRIG, CHOLHDL, LDLDIRECT in the last 72 hours. ?Anemia Panel: ?No results for input(s): VITAMINB12, FOLATE, FERRITIN, TIBC, IRON, RETICCTPCT in the last 72 hours. ?Urine analysis: ?   ?Component Value Date/Time  ? COLORURINE YELLOW (A) 07/13/2021 1731  ? APPEARANCEUR HAZY (A) 07/13/2021 1731  ? APPEARANCEUR Clear 05/05/2021 1137  ? LABSPEC  1.013 07/13/2021 1731  ? PHURINE 5.0 07/13/2021 1731  ? GLUCOSEU NEGATIVE 07/13/2021 1731  ? HGBUR MODERATE (A) 07/13/2021 1731  ? [place] NEGATIVE 07/13/2021 1731  ? BILIRUBINUR Negative 05/05/2021

## 2021-08-20 NOTE — Hospital Course (Signed)
41 year old with PMH of Raynaud's disease, osteoarthritis, obesity and mild disorder presenting with nausea, vomiting, dysphagia and globus sensation and admitted for the same.  [facility] consulted.  She had an EGD that  showed LA grade C esophagitis and distal esophageal ulcers, medium size hiatal hernia and few tiny gastric ulcers (biopsied).  GI recommended soft diet, p.o. Protonix 40 mg twice daily and repeat EGD in 2 to 3 months to document healing of esophageal ulcer and for possible dilation. ? ?Patient tolerated soft diet with improvement in her nausea and vomiting.  She is discharged to follow-up with PCP and GI.  She was advised to avoid NSAIDs.  ? ? ?

## 2021-08-20 NOTE — Assessment & Plan Note (Signed)
Improved.  Tolerated soft diet. ?-P.o. Protonix 40 mg twice daily ?-Zofran as needed ?

## 2021-08-20 NOTE — Assessment & Plan Note (Signed)
Continue home Lexapro. 

## 2021-08-20 NOTE — TOC Transition Note (Signed)
Transition of Care (TOC) - CM/SW Discharge Note ? ? ?Patient Details  ?Name: Crystal Pearson ?MRN: [id number] ?Date of Birth: 25-Jul-1980 ? ?Transition of Care [facility]) CM/SW Contact:  ?Leeroy Cha, RN ?Phone Number: ?08/20/2021, 10:56 AM ? ? ?Clinical Narrative:    ?Discharged to home with no toc needs. ? ? ?Final next level of care: Home/Self Care ?Barriers to Discharge: Continued Medical Work up ? ? ?Patient Goals and CMS Choice ?Patient states their goals for this hospitalization and ongoing recovery are:: to go home ?CMS Medicare.gov Compare Post Acute Care list provided to:: Patient ?Choice offered to / list presented to : Patient ? ?Discharge Placement ?  ?           ?  ?  ?  ?  ? ?Discharge Plan and Services ?  ?Discharge Planning Services: CM Consult ?           ?  ?  ?  ?  ?  ?  ?  ?  ?  ?  ? ?Social Determinants of Health (SDOH) Interventions ?  ? ? ?Readmission Risk Interventions ?   ? View : No data to display.  ?  ?  ?  ? ? ? ? ? ?

## 2021-08-20 NOTE — Assessment & Plan Note (Signed)
Noted on EGD. ?-PPI as above ?-Advised to avoid NSAID. ?-Provided with teaching her resources about nutrition and diet. ?

## 2021-10-14 ENCOUNTER — Other Ambulatory Visit: Payer: Self-pay | Admitting: Obstetrics & Gynecology

## 2021-10-14 DIAGNOSIS — Z1231 Encounter for screening mammogram for malignant neoplasm of breast: Secondary | ICD-10-CM

## 2021-11-01 ENCOUNTER — Ambulatory Visit: Payer: Self-pay

## 2021-11-11 ENCOUNTER — Ambulatory Visit
Admission: RE | Admit: 2021-11-11 | Discharge: 2021-11-11 | Disposition: A | Discharge status: Home / Self Care | Payer: BC Managed Care – PPO | Source: Ambulatory Visit | Attending: Obstetrics & Gynecology | Admitting: Obstetrics & Gynecology

## 2021-11-11 DIAGNOSIS — Z1231 Encounter for screening mammogram for malignant neoplasm of breast: Secondary | ICD-10-CM | POA: Insufficient documentation

## 2022-01-06 ENCOUNTER — Other Ambulatory Visit (HOSPITAL_COMMUNITY): Payer: Self-pay | Admitting: Gastroenterology

## 2022-01-06 ENCOUNTER — Other Ambulatory Visit: Payer: Self-pay | Admitting: Gastroenterology

## 2022-01-06 DIAGNOSIS — R933 Abnormal findings on diagnostic imaging of other parts of digestive tract: Secondary | ICD-10-CM

## 2022-01-27 ENCOUNTER — Encounter (HOSPITAL_COMMUNITY)
Admission: RE | Admit: 2022-01-27 | Discharge: 2022-01-27 | Disposition: A | Discharge status: Home / Self Care | Payer: BC Managed Care – PPO | Source: Ambulatory Visit | Attending: Gastroenterology | Admitting: Gastroenterology

## 2022-01-27 DIAGNOSIS — R933 Abnormal findings on diagnostic imaging of other parts of digestive tract: Secondary | ICD-10-CM | POA: Insufficient documentation

## 2022-01-27 MED ORDER — TECHNETIUM TC 99M SULFUR COLLOID
2.0000 | Freq: Once | INTRAVENOUS | Status: AC | PRN
  Administered 2022-01-27: 2 via ORAL

## 2022-06-05 ENCOUNTER — Encounter (HOSPITAL_COMMUNITY): Payer: Self-pay | Admitting: *Deleted

## 2022-06-05 ENCOUNTER — Telehealth (HOSPITAL_COMMUNITY): Payer: Self-pay

## 2022-06-05 ENCOUNTER — Ambulatory Visit (HOSPITAL_COMMUNITY)
Admission: EM | Admit: 2022-06-05 | Discharge: 2022-06-05 | Disposition: A | Discharge status: Home / Self Care | Payer: BC Managed Care – PPO | Attending: Physician Assistant | Admitting: Physician Assistant

## 2022-06-05 DIAGNOSIS — N644 Mastodynia: Secondary | ICD-10-CM

## 2022-06-05 MED ORDER — CEPHALEXIN 500 MG PO CAPS: 500.0000 mg | ORAL_CAPSULE | Freq: Four times a day (QID) | ORAL | 0 refills | Status: DC

## 2022-06-05 MED ORDER — HYDROCODONE-ACETAMINOPHEN 5-325 MG PO TABS: 1.0000 | ORAL_TABLET | Freq: Two times a day (BID) | ORAL | 0 refills | Status: DC | PRN

## 2022-06-05 NOTE — ED Triage Notes (Signed)
Pt states that she has had left breast/chest pain x couple weeks, but constant for 1 week. She is unable to work today or wear a bra. She said the pain has got worse within the week. She states the left breast is swollen. She took tylenol as needed.

## 2022-06-05 NOTE — ED Notes (Signed)
Will call pt tomorrow ans schedule her ultrasound, pt aware

## 2022-06-05 NOTE — ED Provider Notes (Signed)
[place]    CSN: [id number] Arrival date & time: 06/05/22  1927      History   Chief Complaint Chief Complaint  Patient presents with   Breast Pain    HPI Crystal Pearson is a 42 y.o. female.   Patient presents today with a 2-week history of intermittent left-sided breast pain that has worsened in the past week.  She reports that gradually pain has been worsening and is currently rated 8 on a 0-10 pain scale, described as aching, localized to left breast with radiation into axilla, no aggravating or alleviating factors identified.  She is not breast-feeding and denies any recent injury or increase in activity.  She does have a remote family history of breast cancer in her father side.  She receives mammograms regularly and has never had an abnormal.  She denies any fever but does report some nausea.  Denies any vomiting or fatigue/malaise.  Has tried Tylenol, ibuprofen, ice with minimal improvement of symptoms.  Denies any recent antibiotics.  She is confident she is not pregnant as she is status post partial hysterectomy.  Denies any nipple discharge, nipple inversion, skin change.  Reports she is having difficulty with daily activities as a result of symptoms.    Past Medical History:  Diagnosis Date   Chest pain    Endometriosis    Family history of adverse reaction to anesthesia    Family history of heart disease    History of kidney stones 01/2021   Scoliosis     Patient Active Problem List   Diagnosis Date Noted   Mood disorder ([place]) 08/20/2021   Esophagitis, esophageal ulcer and gastric ulcer 08/20/2021   Esophageal ulcer 08/20/2021   Gastric ulcer 08/20/2021   Elevated BP without diagnosis of hypertension 08/19/2021   Nausea, vomiting and dysphagia 08/19/2021   Dysphagia 08/19/2021   Chest pain 08/18/2021   RLQ abdominal pain 07/13/2021   Right lower quadrant pain 07/13/2021   S/P anterior colporrhaphy 07/02/2021   Primary stress urinary incontinence  06/26/2021   Cystocele, midline 06/26/2021   Obesity (BMI 30.0-34.9) 07/11/2018   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   Scoliosis (and kyphoscoliosis), idiopathic 07/27/2012    Past Surgical History:  Procedure Laterality Date   ABDOMINAL HYSTERECTOMY     APPENDECTOMY     BACK SURGERY     Harrington Rod placement   BIOPSY  08/19/2021   Procedure: BIOPSY;  Surgeon: Otis Brace, MD;  Location: [facility];  Service: Gastroenterology;;   CESAREAN SECTION     x2   CYSTOCELE REPAIR N/A 06/26/2021   Procedure: ANTERIOR REPAIR (CYSTOCELE);  Surgeon: Gae Dry, MD;  Location: [facility];  Service: Gynecology;  Laterality: N/A;   CYSTOSCOPY N/A 06/26/2021   Procedure: CYSTOSCOPY;  Surgeon: Gae Dry, MD;  Location: [facility];  Service: Gynecology;  Laterality: N/A;   endometrial surgery     ESOPHAGOGASTRODUODENOSCOPY N/A 08/19/2021   Procedure: ESOPHAGOGASTRODUODENOSCOPY (EGD);  Surgeon: Otis Brace, MD;  Location: [facility];  Service: Gastroenterology;  Laterality: N/A;  possible w/ dilation   FOOT SURGERY     KNEE SURGERY     LAPAROSCOPY N/A 07/13/2021   Procedure: LAPAROSCOPY DIAGNOSTIC;  Surgeon: Gae Dry, MD;  Location: [facility];  Service: Gynecology;  Laterality: N/A;   OOPHORECTOMY      OB History     Gravida  2   Para  2   Term  0   Preterm  0  AB  0   Living  2      SAB  0   IAB  0   Ectopic  0   Multiple  0   Live Births  0            Home Medications    Prior to Admission medications   Medication Sig Start Date End Date Taking? Authorizing Provider  cephALEXin (KEFLEX) 500 MG capsule Take 1 capsule (500 mg total) by mouth 4 (four) times daily. 06/05/22   Charles Niese, Derry Skill, PA-C  HYDROcodone-acetaminophen (NORCO/VICODIN) 5-325 MG tablet Take 1 tablet by mouth 2 (two) times daily as needed. 06/05/22   Farmer Mccahill, Derry Skill, PA-C  FLUoxetine (PROZAC) 10 MG capsule  07/06/18 07/23/19  [provider]     Family History Family History  Problem Relation Age of Onset   Hypertension Mother    Cancer Father        bladder   Heart disease Father    Breast cancer Neg Hx     Social History Social History   Tobacco Use   Smoking status: Never   Smokeless tobacco: Never  Vaping Use   Vaping Use: Never used  Substance Use Topics   Alcohol use: No   Drug use: No     Allergies   Patient has no known allergies.   Review of Systems Review of Systems  Constitutional:  Negative for activity change, appetite change and fatigue.  Respiratory:  Negative for cough and shortness of breath.   Cardiovascular:  Positive for chest pain (breast).  Gastrointestinal:  Positive for nausea. Negative for abdominal pain, diarrhea and vomiting.     Physical Exam Triage Vital Signs ED Triage Vitals  Enc Vitals Group     BP 06/05/22 1948 (!) 136/91     Pulse Rate 06/05/22 1948 95     Resp 06/05/22 1948 18     Temp 06/05/22 1948 98.4 F (36.9 C)     Temp Source 06/05/22 1948 Oral     SpO2 06/05/22 1948 97 %     Weight --      Height --      Head Circumference --      Peak Flow --      Pain Score 06/05/22 1946 8     Pain Loc --      Pain Edu? --      Excl. in [place]? --    No data found.  Updated Vital Signs BP (!) 136/91 (BP Location: Left Arm)   Pulse 95   Temp 98.4 F (36.9 C) (Oral)   Resp 18   SpO2 97%   Visual Acuity Right Eye Distance:   Left Eye Distance:   Bilateral Distance:    Right Eye Near:   Left Eye Near:    Bilateral Near:     Physical Exam Vitals reviewed.  Constitutional:      General: She is awake. She is not in acute distress.    Appearance: Normal appearance. She is not ill-appearing.  HENT:     Head: Normocephalic and atraumatic.  Cardiovascular:     Rate and Rhythm: Normal rate and regular rhythm.     Heart sounds: No murmur heard. Pulmonary:     Effort: Pulmonary effort is normal.     Breath sounds: Normal breath sounds. No wheezing, rhonchi  or rales.  Abdominal:     General: Bowel sounds are normal.     Palpations: Abdomen is soft.  Tenderness: There is no abdominal tenderness. There is no right CVA tenderness, left CVA tenderness, guarding or rebound.  Musculoskeletal:     Right lower leg: No edema.     Left lower leg: No edema.  Psychiatric:        Behavior: Behavior is cooperative.      UC Treatments / Results  Labs (all labs ordered are listed, but only abnormal results are displayed) Labs Reviewed - No data to display  EKG   Radiology No results found.  Procedures Procedures (including critical care time)  Medications Ordered in UC Medications - No data to display  Initial Impression / Assessment and Plan / UC Course  I have reviewed the triage vital signs and the nursing notes.  Pertinent labs & imaging results that were available during my care of the patient were reviewed by me and considered in my medical decision making (see chart for details).     Patient is well-appearing, afebrile, nontoxic, nontachycardic.  Will cover for mastitis given unilateral breast swelling, tenderness, warmth to touch though there is no discrete obvious infectious region or drainable fluid collection.  She was started on cephalexin.  She was encouraged to use Tylenol ibuprofen for pain.  She has been using high doses of these medications with persistent pain so was provided a few doses of hydrocodone to help with pain management; review of [place] controlled substance database shows no inappropriate refills.  We discussed that this medication can be sedating and it is addictive so she should limit use is much as possible.  Will obtain diagnostic mammogram and ultrasound outpatient.  Recommend close follow-up with her primary care.  Discussed that if she has any worsening or changing symptoms including increasing pain, swelling, nipple inversion, nipple discharge, fever, nausea/vomiting she should be seen immediately.   Strict return precautions given.  Work excuse note provided.  Final Clinical Impressions(s) / UC Diagnoses   Final diagnoses:  Breast pain, left     Discharge Instructions      Start cephalexin to cover for infection.  Take hydrocodone up to twice a day for pain.  This can be sedating so do not drive or drink alcohol while taking it.  You can use Tylenol and ibuprofen over-the-counter for additional symptom relief.  They placed imaging orders for mammogram and ultrasound.  Someone should contact you for thing next week to have this arranged.  Follow-up with your pericare as scheduled.  If you have any worsening symptoms including increasing pain, fever, nausea, vomiting you should be seen immediately.     ED Prescriptions     Medication Sig Dispense Auth. Provider   cephALEXin (KEFLEX) 500 MG capsule Take 1 capsule (500 mg total) by mouth 4 (four) times daily. 20 capsule Elizet Kaplan K, PA-C   HYDROcodone-acetaminophen (NORCO/VICODIN) 5-325 MG tablet  (Status: Discontinued) Take 1 tablet by mouth 2 (two) times daily as needed. 4 tablet Tawn Fitzner K, PA-C   HYDROcodone-acetaminophen (NORCO/VICODIN) 5-325 MG tablet Take 1 tablet by mouth 2 (two) times daily as needed. 4 tablet Shameria Trimarco K, PA-C      I have reviewed the PDMP during this encounter.   Terrilee Croak, PA-C 06/05/22 2015

## 2022-06-05 NOTE — Discharge Instructions (Addendum)
Start cephalexin to cover for infection.  Take hydrocodone up to twice a day for pain.  This can be sedating so do not drive or drink alcohol while taking it.  You can use Tylenol and ibuprofen over-the-counter for additional symptom relief.  They placed imaging orders for mammogram and ultrasound.  Someone should contact you for thing next week to have this arranged.  Follow-up with your pericare as scheduled.  If you have any worsening symptoms including increasing pain, fever, nausea, vomiting you should be seen immediately.

## 2022-06-10 ENCOUNTER — Other Ambulatory Visit: Payer: Self-pay | Admitting: Dermatology

## 2022-06-10 ENCOUNTER — Ambulatory Visit: Payer: BC Managed Care – PPO | Admitting: Dermatology

## 2022-06-10 VITALS — BP 143/77 | HR 114

## 2022-06-10 DIAGNOSIS — L578 Other skin changes due to chronic exposure to nonionizing radiation: Secondary | ICD-10-CM

## 2022-06-10 DIAGNOSIS — L570 Actinic keratosis: Secondary | ICD-10-CM | POA: Diagnosis not present

## 2022-06-10 DIAGNOSIS — D492 Neoplasm of unspecified behavior of bone, soft tissue, and skin: Secondary | ICD-10-CM

## 2022-06-10 NOTE — Progress Notes (Signed)
   New Patient Visit  Subjective  Crystal Pearson is a 42 y.o. female who presents for the following: Skin Problem (The patient has a spot  to be evaluated on her chest, new or changing and the patient has concerns that these could be cancer. ).    The following portions of the chart were reviewed this encounter and updated as appropriate:       Review of Systems:  No other skin or systemic complaints except as noted in HPI or Assessment and Plan.  Objective  Well appearing patient in no apparent distress; mood and affect are within normal limits.  A focused examination was performed including chest. Relevant physical exam findings are noted in the Assessment and Plan.  left upper sternum 1.0 cm pink pearly papule          Assessment & Plan  Neoplasm of skin left upper sternum  Epidermal / dermal shaving  Lesion diameter (cm):  1 Informed consent: discussed and consent obtained   Timeout: patient name, date of birth, surgical site, and procedure verified   Procedure prep:  Patient was prepped and draped in usual sterile fashion Prep type:  Isopropyl alcohol Anesthesia: the lesion was anesthetized in a standard fashion   Anesthetic:  1% lidocaine w/ epinephrine 1-100,000 buffered w/ 8.4% NaHCO3 Instrument used: flexible razor blade   Hemostasis achieved with: pressure, aluminum chloride and electrodesiccation   Outcome: patient tolerated procedure well   Post-procedure details: sterile dressing applied and wound care instructions given   Post-procedure details comment:  Ointment and small bandage applied Dressing type: bandage and petrolatum    Specimen 1 - Surgical pathology Differential Diagnosis: Lichenoid Keratosis vs ISK  R/O BCC   Check Margins: No  Lichenoid Keratosis vs ISK   R/O BCC   Actinic Damage Chest  - chronic, secondary to cumulative UV radiation exposure/sun exposure over time - diffuse scaly erythematous macules with underlying  dyspigmentation - Recommend daily broad spectrum sunscreen SPF 30+ to sun-exposed areas, reapply every 2 hours as needed.  - Recommend staying in the shade or wearing long sleeves, sun glasses (UVA+UVB protection) and wide brim hats (4-inch brim around the entire circumference of the hat). - Call for new or changing lesions.   Return pending bx, if symptoms worsen or fail to improve.   I, Marye Round, CMA, am acting as scribe for Brendolyn Patty, MD .   Documentation: I have reviewed the above documentation for accuracy and completeness, and I agree with the above.  Brendolyn Patty MD

## 2022-06-10 NOTE — Patient Instructions (Addendum)
Wound Care Instructions  Cleanse wound gently with soap and water once a day then pat dry with clean gauze. Apply a thin coat of Petrolatum (petroleum jelly, "Vaseline") over the wound (unless you have an allergy to this). We recommend that you use a new, sterile tube of Vaseline. Do not pick or remove scabs. Do not remove the yellow or white "healing tissue" from the base of the wound.  Cover the wound with fresh, clean, nonstick gauze and secure with paper tape. You may use Band-Aids in place of gauze and tape if the wound is small enough, but would recommend trimming much of the tape off as there is often too much. Sometimes Band-Aids can irritate the skin.  You should call the office for your biopsy report after 1 week if you have not already been contacted.  If you experience any problems, such as abnormal amounts of bleeding, swelling, significant bruising, significant pain, or evidence of infection, please call the office immediately.  FOR ADULT SURGERY PATIENTS: If you need something for pain relief you may take 1 extra strength Tylenol (acetaminophen) AND 2 Ibuprofen (200mg each) together every 4 hours as needed for pain. (do not take these if you are allergic to them or if you have a reason you should not take them.) Typically, you may only need pain medication for 1 to 3 days.     Due to recent changes in healthcare laws, you may see results of your pathology and/or laboratory studies on MyChart before the doctors have had a chance to review them. We understand that in some cases there may be results that are confusing or concerning to you. Please understand that not all results are received at the same time and often the doctors may need to interpret multiple results in order to provide you with the best plan of care or course of treatment. Therefore, we ask that you please give us 2 business days to thoroughly review all your results before contacting the office for clarification. Should  we see a critical lab result, you will be contacted sooner.   If You Need Anything After Your Visit  If you have any questions or concerns for your doctor, please call our main line at [phone] and press option 4 to reach your doctor's medical assistant. If no one answers, please leave a voicemail as directed and we will return your call as soon as possible. Messages left after 4 pm will be answered the following business day.   You may also send us a message via MyChart. We typically respond to MyChart messages within 1-2 business days.  For prescription refills, please ask your pharmacy to contact our office. Our fax number is [phone].  If you have an urgent issue when the clinic is closed that cannot wait until the next business day, you can page your doctor at the number below.    Please note that while we do our best to be available for urgent issues outside of office hours, we are not available 24/7.   If you have an urgent issue and are unable to reach us, you may choose to seek medical care at your doctor's office, retail clinic, urgent care center, or emergency room.  If you have a medical emergency, please immediately call 911 or go to the emergency department.  Pager Numbers  - Dr. Kowalski: [phone]  - Dr. Moye: [phone]  - Dr. Stewart: [phone]  In the event of inclement weather, please call our main line at   [phone] for an update on the status of any delays or closures.  Dermatology Medication Tips: Please keep the boxes that topical medications come in in order to help keep track of the instructions about where and how to use these. Pharmacies typically print the medication instructions only on the boxes and not directly on the medication tubes.   If your medication is too expensive, please contact our office at [phone] option 4 or send us a message through MyChart.   We are unable to tell what your co-pay for medications will be in  advance as this is different depending on your insurance coverage. However, we may be able to find a substitute medication at lower cost or fill out paperwork to get insurance to cover a needed medication.   If a prior authorization is required to get your medication covered by your insurance company, please allow us 1-2 business days to complete this process.  Drug prices often vary depending on where the prescription is filled and some pharmacies may offer cheaper prices.  The website www.goodrx.com contains coupons for medications through different pharmacies. The prices here do not account for what the cost may be with help from insurance (it may be cheaper with your insurance), but the website can give you the price if you did not use any insurance.  - You can print the associated coupon and take it with your prescription to the pharmacy.  - You may also stop by our office during regular business hours and pick up a GoodRx coupon card.  - If you need your prescription sent electronically to a different pharmacy, notify our office through [facility] MyChart or by phone at [phone] option 4.     Si Usted Necesita Algo Despus de Su Visita  Tambin puede enviarnos un mensaje a travs de MyChart. Por lo general respondemos a los mensajes de MyChart en el transcurso de 1 a 2 das hbiles.  Para renovar recetas, por favor pida a su farmacia que se ponga en contacto con nuestra oficina. Nuestro nmero de fax es el [phone].  Si tiene un asunto urgente cuando la clnica est cerrada y que no puede esperar hasta el siguiente da hbil, puede llamar/localizar a su doctor(a) al nmero que aparece a continuacin.   Por favor, tenga en cuenta que aunque hacemos todo lo posible para estar disponibles para asuntos urgentes fuera del horario de oficina, no estamos disponibles las 24 horas del da, los 7 das de la semana.   Si tiene un problema urgente y no puede comunicarse con nosotros, puede  optar por buscar atencin mdica  en el consultorio de su doctor(a), en una clnica privada, en un centro de atencin urgente o en una sala de emergencias.  Si tiene una emergencia mdica, por favor llame inmediatamente al 911 o vaya a la sala de emergencias.  Nmeros de bper  - Dr. Kowalski: [phone]  - Dra. Moye: [phone]  - Dra. Stewart: [phone]  En caso de inclemencias del tiempo, por favor llame a nuestra lnea principal al [phone] para una actualizacin sobre el estado de cualquier retraso o cierre.  Consejos para la medicacin en dermatologa: Por favor, guarde las cajas en las que vienen los medicamentos de uso tpico para ayudarle a seguir las instrucciones sobre dnde y cmo usarlos. Las farmacias generalmente imprimen las instrucciones del medicamento slo en las cajas y no directamente en los tubos del medicamento.   Si su medicamento es muy caro, por favor, pngase en contacto con   nuestra oficina llamando al [phone] y presione la opcin 4 o envenos un mensaje a travs de MyChart.   No podemos decirle cul ser su copago por los medicamentos por adelantado ya que esto es diferente dependiendo de la cobertura de su seguro. Sin embargo, es posible que podamos encontrar un medicamento sustituto a menor costo o llenar un formulario para que el seguro cubra el medicamento que se considera necesario.   Si se requiere una autorizacin previa para que su compaa de seguros cubra su medicamento, por favor permtanos de 1 a 2 das hbiles para completar este proceso.  Los precios de los medicamentos varan con frecuencia dependiendo del lugar de dnde se surte la receta y alguna farmacias pueden ofrecer precios ms baratos.  El sitio web www.goodrx.com tiene cupones para medicamentos de diferentes farmacias. Los precios aqu no tienen en cuenta lo que podra costar con la ayuda del seguro (puede ser ms barato con su seguro), pero el sitio web puede darle el  precio si no utiliz ningn seguro.  - Puede imprimir el cupn correspondiente y llevarlo con su receta a la farmacia.  - Tambin puede pasar por nuestra oficina durante el horario de atencin regular y recoger una tarjeta de cupones de GoodRx.  - Si necesita que su receta se enve electrnicamente a una farmacia diferente, informe a nuestra oficina a travs de MyChart de [place] o por telfono llamando al [phone] y presione la opcin 4.  

## 2022-06-16 ENCOUNTER — Other Ambulatory Visit: Payer: BC Managed Care – PPO

## 2022-06-16 ENCOUNTER — Telehealth: Payer: Self-pay

## 2022-06-16 NOTE — Telephone Encounter (Signed)
Advised patient biopsy of the left upper sternum was benign inflamed keratosis and no further treatment needed.

## 2022-06-16 NOTE — Telephone Encounter (Signed)
-----   Message from Brendolyn Patty, MD sent at 06/15/2022  8:27 PM EST ----- Skin , left upper sternum LICHENOID KERATOSIS  Benign inflamed keratosis, no further treatment necessary - please call patient

## 2022-09-03 ENCOUNTER — Emergency Department: Payer: BC Managed Care – PPO

## 2022-09-03 ENCOUNTER — Observation Stay: Payer: BC Managed Care – PPO | Admitting: Anesthesiology

## 2022-09-03 ENCOUNTER — Other Ambulatory Visit: Payer: Self-pay

## 2022-09-03 ENCOUNTER — Observation Stay
Admission: EM | Admit: 2022-09-03 | Discharge: 2022-09-04 | Disposition: A | Discharge status: Home / Self Care | Payer: BC Managed Care – PPO | Attending: Surgery | Admitting: Surgery

## 2022-09-03 ENCOUNTER — Encounter
Admission: EM | Disposition: A | Discharge status: Home / Self Care | Payer: Self-pay | Source: Home / Self Care | Attending: Emergency Medicine

## 2022-09-03 DIAGNOSIS — Z79899 Other long term (current) drug therapy: Secondary | ICD-10-CM | POA: Diagnosis not present

## 2022-09-03 DIAGNOSIS — K8010 Calculus of gallbladder with chronic cholecystitis without obstruction: Principal | ICD-10-CM | POA: Insufficient documentation

## 2022-09-03 DIAGNOSIS — K819 Cholecystitis, unspecified: Secondary | ICD-10-CM | POA: Diagnosis present

## 2022-09-03 DIAGNOSIS — R1112 Projectile vomiting: Secondary | ICD-10-CM

## 2022-09-03 DIAGNOSIS — R1031 Right lower quadrant pain: Secondary | ICD-10-CM | POA: Insufficient documentation

## 2022-09-03 DIAGNOSIS — R112 Nausea with vomiting, unspecified: Secondary | ICD-10-CM | POA: Diagnosis not present

## 2022-09-03 DIAGNOSIS — K810 Acute cholecystitis: Secondary | ICD-10-CM | POA: Diagnosis not present

## 2022-09-03 DIAGNOSIS — R0789 Other chest pain: Secondary | ICD-10-CM | POA: Diagnosis present

## 2022-09-03 DIAGNOSIS — R9431 Abnormal electrocardiogram [ECG] [EKG]: Secondary | ICD-10-CM | POA: Insufficient documentation

## 2022-09-03 LAB — COMPREHENSIVE METABOLIC PANEL
ALT: 39 U/L (ref 0–44)
AST: 64 U/L — ABNORMAL HIGH (ref 15–41)
Albumin: 4.1 g/dL (ref 3.5–5.0)
Alkaline Phosphatase: 69 U/L (ref 38–126)
Anion gap: 10 (ref 5–15)
BUN: 12 mg/dL (ref 6–20)
CO2: 25 mmol/L (ref 22–32)
Calcium: 8.7 mg/dL — ABNORMAL LOW (ref 8.9–10.3)
Chloride: 101 mmol/L (ref 98–111)
Creatinine, Ser: 0.95 mg/dL (ref 0.44–1.00)
GFR, Estimated: >60 mL/min (ref >60)
Glucose, Bld: 123 mg/dL — ABNORMAL HIGH (ref 70–99)
Potassium: 3.6 mmol/L (ref 3.5–5.1)
Sodium: 136 mmol/L (ref 135–145)
Total Bilirubin: 0.6 mg/dL (ref 0.3–1.2)
Total Protein: 7 g/dL (ref 6.5–8.1)

## 2022-09-03 LAB — CBC WITH DIFFERENTIAL/PLATELET
Abs Immature Granulocytes: 0.1 10*3/uL — ABNORMAL HIGH (ref 0.00–0.07)
Basophils Absolute: 0.1 10*3/uL (ref 0.0–0.1)
Basophils Relative: 1 %
Eosinophils Absolute: 0.4 10*3/uL (ref 0.0–0.5)
Eosinophils Relative: 3 %
HCT: 40.6 % (ref 36.0–46.0)
Hemoglobin: 13 g/dL (ref 12.0–15.0)
Immature Granulocytes: 1 %
Lymphocytes Relative: 39 %
Lymphs Abs: 6.8 10*3/uL — ABNORMAL HIGH (ref 0.7–4.0)
MCH: 27.7 pg (ref 26.0–34.0)
MCHC: 32 g/dL (ref 30.0–36.0)
MCV: 86.6 fL (ref 80.0–100.0)
Monocytes Absolute: 1.2 10*3/uL — ABNORMAL HIGH (ref 0.1–1.0)
Monocytes Relative: 7 %
Neutro Abs: 8.7 10*3/uL — ABNORMAL HIGH (ref 1.7–7.7)
Neutrophils Relative %: 49 %
Platelets: 306 10*3/uL (ref 150–400)
RBC: 4.69 MIL/uL (ref 3.87–5.11)
RDW: 13.7 % (ref 11.5–15.5)
Smear Review: NORMAL
WBC: 17.3 10*3/uL — ABNORMAL HIGH (ref 4.0–10.5)
nRBC: 0 % (ref 0.0–0.2)

## 2022-09-03 LAB — CBC
HCT: 44.7 % (ref 36.0–46.0)
Hemoglobin: 13.8 g/dL (ref 12.0–15.0)
MCH: 27.3 pg (ref 26.0–34.0)
MCHC: 30.9 g/dL (ref 30.0–36.0)
MCV: 88.3 fL (ref 80.0–100.0)
Platelets: 305 10*3/uL (ref 150–400)
RBC: 5.06 MIL/uL (ref 3.87–5.11)
RDW: 13.8 % (ref 11.5–15.5)
WBC: 19.6 10*3/uL — ABNORMAL HIGH (ref 4.0–10.5)
nRBC: 0 % (ref 0.0–0.2)

## 2022-09-03 LAB — TROPONIN I (HIGH SENSITIVITY)
Troponin I (High Sensitivity): 3 ng/L (ref <18)
Troponin I (High Sensitivity): 5 ng/L (ref <18)

## 2022-09-03 LAB — CREATININE, SERUM
Creatinine, Ser: 0.9 mg/dL (ref 0.44–1.00)
GFR, Estimated: >60 mL/min (ref >60)

## 2022-09-03 LAB — HIV ANTIBODY (ROUTINE TESTING W REFLEX): HIV Screen 4th Generation wRfx: NONREACTIVE

## 2022-09-03 LAB — LIPASE, BLOOD: Lipase: 34 U/L (ref 11–51)

## 2022-09-03 SURGERY — CHOLECYSTECTOMY, ROBOT-ASSISTED, LAPAROSCOPIC
Anesthesia: General

## 2022-09-03 MED ORDER — FENTANYL CITRATE (PF) 100 MCG/2ML IJ SOLN
25.0000 ug | INTRAMUSCULAR | Status: DC | PRN
  Administered 2022-09-03: 50 ug via INTRAVENOUS
  Administered 2022-09-03 (×2): 25 ug via INTRAVENOUS
  Administered 2022-09-03: 50 ug via INTRAVENOUS

## 2022-09-03 MED ORDER — ACETAMINOPHEN 500 MG PO TABS
1000.0000 mg | ORAL_TABLET | Freq: Four times a day (QID) | ORAL | Status: DC
  Administered 2022-09-03 – 2022-09-04 (×3): 1000 mg via ORAL
  Filled 2022-09-03 (×3): qty 2

## 2022-09-03 MED ORDER — BUPIVACAINE-EPINEPHRINE (PF) 0.25% -1:200000 IJ SOLN
INTRAMUSCULAR | Status: DC | PRN
  Administered 2022-09-03: 50 mL via INTRAMUSCULAR

## 2022-09-03 MED ORDER — PHENYLEPHRINE 80 MCG/ML (10ML) SYRINGE FOR IV PUSH (FOR BLOOD PRESSURE SUPPORT)
PREFILLED_SYRINGE | INTRAVENOUS | Status: DC | PRN
  Administered 2022-09-03 (×2): 80 ug via INTRAVENOUS
  Administered 2022-09-03: 160 ug via INTRAVENOUS
  Administered 2022-09-03 (×2): 80 ug via INTRAVENOUS

## 2022-09-03 MED ORDER — PROCHLORPERAZINE EDISYLATE 10 MG/2ML IJ SOLN: 5.0000 mg | Freq: Four times a day (QID) | INTRAMUSCULAR | Status: DC | PRN

## 2022-09-03 MED ORDER — BUPIVACAINE LIPOSOME 1.3 % IJ SUSP
INTRAMUSCULAR | Status: AC
  Filled 2022-09-03: qty 20

## 2022-09-03 MED ORDER — FENTANYL CITRATE (PF) 100 MCG/2ML IJ SOLN
INTRAMUSCULAR | Status: AC
  Filled 2022-09-03: qty 2

## 2022-09-03 MED ORDER — BUPIVACAINE HCL (PF) 0.25 % IJ SOLN
INTRAMUSCULAR | Status: AC
  Filled 2022-09-03: qty 30

## 2022-09-03 MED ORDER — EPINEPHRINE PF 1 MG/ML IJ SOLN
INTRAMUSCULAR | Status: AC
  Filled 2022-09-03: qty 1

## 2022-09-03 MED ORDER — KETOROLAC TROMETHAMINE 30 MG/ML IJ SOLN
30.0000 mg | Freq: Four times a day (QID) | INTRAMUSCULAR | Status: DC
  Administered 2022-09-03 – 2022-09-04 (×3): 30 mg via INTRAVENOUS
  Filled 2022-09-03 (×3): qty 1

## 2022-09-03 MED ORDER — KETOROLAC TROMETHAMINE 30 MG/ML IJ SOLN: 30.0000 mg | Freq: Four times a day (QID) | INTRAMUSCULAR | Status: DC | PRN

## 2022-09-03 MED ORDER — ONDANSETRON HCL 4 MG/2ML IJ SOLN
4.0000 mg | Freq: Once | INTRAMUSCULAR | Status: AC
  Administered 2022-09-03: 4 mg via INTRAVENOUS
  Filled 2022-09-03: qty 2

## 2022-09-03 MED ORDER — LIDOCAINE HCL (CARDIAC) PF 100 MG/5ML IV SOSY
PREFILLED_SYRINGE | INTRAVENOUS | Status: DC | PRN
  Administered 2022-09-03: 100 mg via INTRAVENOUS

## 2022-09-03 MED ORDER — SUGAMMADEX SODIUM 200 MG/2ML IV SOLN
INTRAVENOUS | Status: DC | PRN
  Administered 2022-09-03: 200 mg via INTRAVENOUS

## 2022-09-03 MED ORDER — KETOROLAC TROMETHAMINE 30 MG/ML IJ SOLN
INTRAMUSCULAR | Status: AC
  Filled 2022-09-03: qty 1

## 2022-09-03 MED ORDER — DIPHENHYDRAMINE HCL 12.5 MG/5ML PO ELIX: 12.5000 mg | ORAL_SOLUTION | Freq: Four times a day (QID) | ORAL | Status: DC | PRN

## 2022-09-03 MED ORDER — OXYCODONE HCL 5 MG PO TABS
5.0000 mg | ORAL_TABLET | Freq: Once | ORAL | Status: AC | PRN
  Administered 2022-09-03: 5 mg via ORAL

## 2022-09-03 MED ORDER — DEXAMETHASONE SODIUM PHOSPHATE 10 MG/ML IJ SOLN
INTRAMUSCULAR | Status: DC | PRN
  Administered 2022-09-03: 10 mg via INTRAVENOUS

## 2022-09-03 MED ORDER — ONDANSETRON HCL 4 MG/2ML IJ SOLN
INTRAMUSCULAR | Status: DC | PRN
  Administered 2022-09-03: 4 mg via INTRAVENOUS

## 2022-09-03 MED ORDER — ACETAMINOPHEN 10 MG/ML IV SOLN
INTRAVENOUS | Status: DC | PRN
  Administered 2022-09-03: 1000 mg via INTRAVENOUS

## 2022-09-03 MED ORDER — OXYCODONE HCL 5 MG PO TABS
ORAL_TABLET | ORAL | Status: AC
  Filled 2022-09-03: qty 1

## 2022-09-03 MED ORDER — KETOROLAC TROMETHAMINE 30 MG/ML IJ SOLN
INTRAMUSCULAR | Status: DC | PRN
  Administered 2022-09-03: 30 mg via INTRAVENOUS

## 2022-09-03 MED ORDER — HEPARIN SODIUM (PORCINE) 5000 UNIT/ML IJ SOLN
5000.0000 [IU] | Freq: Three times a day (TID) | INTRAMUSCULAR | Status: DC
  Administered 2022-09-03 – 2022-09-04 (×3): 5000 [IU] via SUBCUTANEOUS
  Filled 2022-09-03 (×3): qty 1

## 2022-09-03 MED ORDER — ROCURONIUM BROMIDE 100 MG/10ML IV SOLN
INTRAVENOUS | Status: DC | PRN
  Administered 2022-09-03: 60 mg via INTRAVENOUS

## 2022-09-03 MED ORDER — FENTANYL CITRATE PF 50 MCG/ML IJ SOSY
50.0000 ug | PREFILLED_SYRINGE | INTRAMUSCULAR | Status: AC | PRN
  Administered 2022-09-03 (×2): 50 ug via INTRAVENOUS
  Filled 2022-09-03 (×2): qty 1

## 2022-09-03 MED ORDER — MIDAZOLAM HCL 2 MG/2ML IJ SOLN
INTRAMUSCULAR | Status: AC
  Filled 2022-09-03: qty 2

## 2022-09-03 MED ORDER — DIPHENHYDRAMINE HCL 50 MG/ML IJ SOLN: 12.5000 mg | Freq: Four times a day (QID) | INTRAMUSCULAR | Status: DC | PRN

## 2022-09-03 MED ORDER — ONDANSETRON 4 MG PO TBDP: 4.0000 mg | ORAL_TABLET | Freq: Four times a day (QID) | ORAL | Status: DC | PRN

## 2022-09-03 MED ORDER — LACTATED RINGERS IV BOLUS
1000.0000 mL | Freq: Once | INTRAVENOUS | Status: AC
  Administered 2022-09-03: 1000 mL via INTRAVENOUS

## 2022-09-03 MED ORDER — MORPHINE SULFATE (PF) 4 MG/ML IV SOLN
4.0000 mg | INTRAVENOUS | Status: DC | PRN
  Administered 2022-09-03 – 2022-09-04 (×2): 4 mg via INTRAVENOUS
  Filled 2022-09-03 (×2): qty 1

## 2022-09-03 MED ORDER — MORPHINE SULFATE (PF) 4 MG/ML IV SOLN
4.0000 mg | Freq: Once | INTRAVENOUS | Status: AC
  Administered 2022-09-03: 4 mg via INTRAVENOUS
  Filled 2022-09-03: qty 1

## 2022-09-03 MED ORDER — INDOCYANINE GREEN 25 MG IV SOLR
2.5000 mg | INTRAVENOUS | Status: AC
  Administered 2022-09-03: 2.5 mg via INTRAVENOUS
  Filled 2022-09-03: qty 1

## 2022-09-03 MED ORDER — ACETAMINOPHEN 10 MG/ML IV SOLN
INTRAVENOUS | Status: AC
  Filled 2022-09-03: qty 100

## 2022-09-03 MED ORDER — PHENYLEPHRINE 80 MCG/ML (10ML) SYRINGE FOR IV PUSH (FOR BLOOD PRESSURE SUPPORT)
PREFILLED_SYRINGE | INTRAVENOUS | Status: AC
  Filled 2022-09-03: qty 10

## 2022-09-03 MED ORDER — PROPOFOL 10 MG/ML IV BOLUS
INTRAVENOUS | Status: AC
  Filled 2022-09-03: qty 20

## 2022-09-03 MED ORDER — PROCHLORPERAZINE MALEATE 10 MG PO TABS: 10.0000 mg | ORAL_TABLET | Freq: Four times a day (QID) | ORAL | Status: DC | PRN

## 2022-09-03 MED ORDER — PROPOFOL 10 MG/ML IV BOLUS
INTRAVENOUS | Status: DC | PRN
  Administered 2022-09-03: 200 mg via INTRAVENOUS

## 2022-09-03 MED ORDER — OXYCODONE HCL 5 MG/5ML PO SOLN: 5.0000 mg | Freq: Once | ORAL | Status: AC | PRN

## 2022-09-03 MED ORDER — OXYCODONE HCL 5 MG PO TABS
5.0000 mg | ORAL_TABLET | ORAL | Status: DC | PRN
  Administered 2022-09-03: 10 mg via ORAL
  Administered 2022-09-03: 5 mg via ORAL
  Administered 2022-09-04: 10 mg via ORAL
  Filled 2022-09-03 (×2): qty 2
  Filled 2022-09-03: qty 1

## 2022-09-03 MED ORDER — PIPERACILLIN-TAZOBACTAM 3.375 G IVPB 30 MIN
3.3750 g | Freq: Once | INTRAVENOUS | Status: DC
  Filled 2022-09-03: qty 50

## 2022-09-03 MED ORDER — ONDANSETRON HCL 4 MG/2ML IJ SOLN: 4.0000 mg | Freq: Four times a day (QID) | INTRAMUSCULAR | Status: DC | PRN

## 2022-09-03 MED ORDER — MIDAZOLAM HCL 2 MG/2ML IJ SOLN
INTRAMUSCULAR | Status: DC | PRN
  Administered 2022-09-03: 2 mg via INTRAVENOUS

## 2022-09-03 MED ORDER — SODIUM CHLORIDE 0.9 % IV SOLN: INTRAVENOUS | Status: DC

## 2022-09-03 MED ORDER — PANTOPRAZOLE SODIUM 40 MG IV SOLR
40.0000 mg | Freq: Every day | INTRAVENOUS | Status: DC
  Administered 2022-09-03: 40 mg via INTRAVENOUS
  Filled 2022-09-03: qty 10

## 2022-09-03 MED ORDER — 0.9 % SODIUM CHLORIDE (POUR BTL) OPTIME
TOPICAL | Status: DC | PRN
  Administered 2022-09-03: 500 mL

## 2022-09-03 MED ORDER — PIPERACILLIN-TAZOBACTAM 3.375 G IVPB
3.3750 g | Freq: Three times a day (TID) | INTRAVENOUS | Status: DC
  Administered 2022-09-03 – 2022-09-04 (×4): 3.375 g via INTRAVENOUS
  Filled 2022-09-03 (×3): qty 50

## 2022-09-03 MED ORDER — LACTATED RINGERS IV SOLN: INTRAVENOUS | Status: DC

## 2022-09-03 MED ORDER — ACETAMINOPHEN 10 MG/ML IV SOLN: 1000.0000 mg | Freq: Once | INTRAVENOUS | Status: DC | PRN

## 2022-09-03 MED ORDER — ONDANSETRON HCL 4 MG/2ML IJ SOLN: 4.0000 mg | Freq: Once | INTRAMUSCULAR | Status: DC | PRN

## 2022-09-03 MED ORDER — DIPHENHYDRAMINE-ZINC ACETATE 2-0.1 % EX CREA
TOPICAL_CREAM | Freq: Every day | CUTANEOUS | Status: DC | PRN
  Filled 2022-09-03: qty 28

## 2022-09-03 MED ORDER — FENTANYL CITRATE (PF) 100 MCG/2ML IJ SOLN
INTRAMUSCULAR | Status: DC | PRN
  Administered 2022-09-03: 50 ug via INTRAVENOUS

## 2022-09-03 SURGICAL SUPPLY — 49 items
ADH SKN CLS APL DERMABOND .7 (GAUZE/BANDAGES/DRESSINGS) ×1
CANNULA REDUCER 12-8 DVNC XI (CANNULA) ×1 IMPLANT
CATH REDDICK CHOLANGI 4FR 50CM (CATHETERS) IMPLANT
CAUTERY HOOK MNPLR 1.6 DVNC XI (INSTRUMENTS) ×1 IMPLANT
CLIP LIGATING HEMO O LOK GREEN (MISCELLANEOUS) ×1 IMPLANT
DERMABOND ADVANCED .7 DNX12 (GAUZE/BANDAGES/DRESSINGS) ×1 IMPLANT
DRAPE ARM DVNC X/XI (DISPOSABLE) ×4 IMPLANT
DRAPE COLUMN DVNC XI (DISPOSABLE) ×1 IMPLANT
ELECT CAUTERY BLADE 6.4 (BLADE) ×1 IMPLANT
ELECT REM PT RETURN 9FT ADLT (ELECTROSURGICAL) ×1
ELECTRODE REM PT RTRN 9FT ADLT (ELECTROSURGICAL) ×1 IMPLANT
FORCEPS BPLR R/ABLATION 8 DVNC (INSTRUMENTS) ×1 IMPLANT
FORCEPS PROGRASP DVNC XI (FORCEP) ×1 IMPLANT
GLOVE BIO SURGEON STRL SZ7 (GLOVE) ×2 IMPLANT
GOWN STRL REUS W/ TWL LRG LVL3 (GOWN DISPOSABLE) ×4 IMPLANT
GOWN STRL REUS W/TWL LRG LVL3 (GOWN DISPOSABLE) ×4
IRRIGATION STRYKERFLOW (MISCELLANEOUS) IMPLANT
IRRIGATOR STRYKERFLOW (MISCELLANEOUS)
IV CATH ANGIO 12GX3 LT BLUE (NEEDLE) IMPLANT
KIT PINK PAD W/HEAD ARE REST (MISCELLANEOUS) ×1 IMPLANT
KIT PINK PAD W/HEAD ARM REST (MISCELLANEOUS) ×1 IMPLANT
LABEL OR SOLS (LABEL) ×1 IMPLANT
MANIFOLD NEPTUNE II (INSTRUMENTS) ×1 IMPLANT
NDL HYPO 22X1.5 SAFETY MO (MISCELLANEOUS) ×1 IMPLANT
NEEDLE HYPO 22X1.5 SAFETY MO (MISCELLANEOUS) ×1 IMPLANT
NS IRRIG 500ML POUR BTL (IV SOLUTION) ×1 IMPLANT
OBTURATOR OPTICAL STND 8 DVNC (TROCAR) ×1
OBTURATOR OPTICALSTD 8 DVNC (TROCAR) ×1 IMPLANT
PACK LAP CHOLECYSTECTOMY (MISCELLANEOUS) ×1 IMPLANT
PENCIL SMOKE EVACUATOR (MISCELLANEOUS) ×1 IMPLANT
SEAL UNIV 5-12 XI (MISCELLANEOUS) ×4 IMPLANT
SET TUBE SMOKE EVAC HIGH FLOW (TUBING) ×1 IMPLANT
SOL ELECTROSURG ANTI STICK (MISCELLANEOUS) ×1
SOLUTION ELECTROSURG ANTI STCK (MISCELLANEOUS) ×1 IMPLANT
SPIKE FLUID TRANSFER (MISCELLANEOUS) ×1 IMPLANT
SPONGE T-LAP 18X18 ~~LOC~~+RFID (SPONGE) ×1 IMPLANT
SPONGE T-LAP 4X18 ~~LOC~~+RFID (SPONGE) IMPLANT
STOPCOCK 3WAY MALE LL (IV SETS) IMPLANT
SUT MNCRL 4-0 (SUTURE) ×1
SUT MNCRL 4-0 27XMFL (SUTURE) ×1
SUT MNCRL AB 4-0 PS2 18 (SUTURE) ×1 IMPLANT
SUT VICRYL 0 UR6 27IN ABS (SUTURE) ×2 IMPLANT
SUTURE MNCRL 4-0 27XMF (SUTURE) IMPLANT
SYR 20ML LL LF (SYRINGE) IMPLANT
SYS BAG RETRIEVAL 10MM (BASKET) ×1
SYSTEM BAG RETRIEVAL 10MM (BASKET) ×1 IMPLANT
TRAP FLUID SMOKE EVACUATOR (MISCELLANEOUS) ×1 IMPLANT
WATER STERILE IRR 3000ML UROMA (IV SOLUTION) IMPLANT
WATER STERILE IRR 500ML POUR (IV SOLUTION) ×1 IMPLANT

## 2022-09-03 NOTE — ED Provider Notes (Signed)
[facility] Provider Note    Event Date/Time   First MD Initiated Contact with Patient 09/03/22 [phone]     (approximate)   History   Chest Pain   HPI  Crystal Pearson is a 42 y.o. female who presents to the ED for evaluation of Chest Pain   I reviewed medical DC summary from 4/26.  She was admitted for 1 days due to emesis, globus sensation and esophagitis.  Started on PPI.   Patient presents to the ED with her husband for evaluation of about 24 hours of epigastric pain and nausea.  No emesis.  Chills but no fevers.  More severe and different than her esophagitis.  No cough, syncope or chest pain.  Physical Exam   Triage Vital Signs: ED Triage Vitals  Enc Vitals Group     BP 09/03/22 0239 122/71     Pulse Rate 09/03/22 0239 68     Resp 09/03/22 0239 20     Temp 09/03/22 0239 97.8 F (36.6 C)     Temp Source 09/03/22 0239 Oral     SpO2 09/03/22 0239 99 %     Weight 09/03/22 0240 194 lb (88 kg)     Height 09/03/22 0240 5\' 5"  (1.651 m)     Head Circumference --      Peak Flow --      Pain Score 09/03/22 0239 8     Pain Loc --      Pain Edu? --      Excl. in GC? --     Most recent vital signs: Vitals:   09/03/22 0239  BP: 122/71  Pulse: 68  Resp: 20  Temp: 97.8 F (36.6 C)  SpO2: 99%    General: Awake, no distress.  CV:  Good peripheral perfusion.  Resp:  Normal effort.  Abd:  No distention.  Epigastric tenderness without peritoneal features.  Lower abdomen is benign. MSK:  No deformity noted.  Neuro:  No focal deficits appreciated. Other:     ED Results / Procedures / Treatments   Labs (all labs ordered are listed, but only abnormal results are displayed) Labs Reviewed  CBC WITH DIFFERENTIAL/PLATELET - Abnormal; Notable for the following components:      Result Value   WBC 17.3 (*)    Neutro Abs 8.7 (*)    Lymphs Abs 6.8 (*)    Monocytes Absolute 1.2 (*)    Abs Immature Granulocytes 0.10 (*)    All other components within  normal limits  COMPREHENSIVE METABOLIC PANEL - Abnormal; Notable for the following components:   Glucose, Bld 123 (*)    Calcium 8.7 (*)    AST 64 (*)    All other components within normal limits  LIPASE, BLOOD  HIV ANTIBODY (ROUTINE TESTING W REFLEX)  CBC  CREATININE, SERUM  TROPONIN I (HIGH SENSITIVITY)  TROPONIN I (HIGH SENSITIVITY)    EKG Sinus rhythm at a rate of 83 bpm.  Normal axis and intervals.  No clear signs of acute ischemia.  RADIOLOGY CXR interpreted by me without evidence of acute cardiopulmonary pathology. RUQ ultrasound interpreted by me with evidence of cholecystitis.  Official radiology report(s): US Abdomen Limited RUQ (LIVER/GB)  Result Date: 09/03/2022 CLINICAL DATA:  Chest pain. EXAM: ULTRASOUND ABDOMEN LIMITED RIGHT UPPER QUADRANT COMPARISON:  CT abdomen pelvis dated 07/13/2021 and abdominal ultrasound dated 02/10/2007. FINDINGS: Gallbladder: There is a small gallstone. The gallbladder wall appears thickened and slightly edematous measuring up to 8 mm in thickness.  Positive sonographic Murphy's sign reported. Common bile duct: Diameter: 5 mm Liver: There is diffuse increased liver echogenicity most commonly seen in the setting of fatty infiltration. Superimposed inflammation or fibrosis is not excluded. Clinical correlation is recommended. Portal vein is patent on color Doppler imaging with normal direction of blood flow towards the liver. Other: None. IMPRESSION: 1. Cholelithiasis with findings suspicious for acute cholecystitis. A hepatobiliary scintigraphy may provide better evaluation of the gallbladder if there is a high clinical concern for acute cholecystitis . 2. Fatty liver. Electronically Signed   By: Elgie Collard M.D.   On: 09/03/2022 03:47   DG Chest 1 View  Result Date: 09/03/2022 CLINICAL DATA:  Chest pain EXAM: PORTABLE CHEST 1 VIEW COMPARISON:  08/18/2021 FINDINGS: Cardiac shadow is stable. Fixation rods are noted in the thoracolumbar spine. The  lungs are clear. No acute bony abnormality is seen. IMPRESSION: No acute abnormality noted. Electronically Signed   By: Alcide Clever M.D.   On: 09/03/2022 03:05    PROCEDURES and INTERVENTIONS:  Procedures  Medications  fentaNYL (SUBLIMAZE) injection 50 mcg (50 mcg Intravenous Given 09/03/22 0255)  heparin injection 5,000 Units (has no administration in time range)  0.9 %  sodium chloride infusion (has no administration in time range)  piperacillin-tazobactam (ZOSYN) IVPB 3.375 g (3.375 g Intravenous New Bag/Given 09/03/22 0420)  ketorolac (TORADOL) 30 MG/ML injection 30 mg (has no administration in time range)  oxyCODONE (Oxy IR/ROXICODONE) immediate release tablet 5-10 mg (has no administration in time range)  morphine (PF) 4 MG/ML injection 4 mg (has no administration in time range)  diphenhydrAMINE (BENADRYL) 12.5 MG/5ML elixir 12.5 mg (has no administration in time range)    Or  diphenhydrAMINE (BENADRYL) injection 12.5 mg (has no administration in time range)  ondansetron (ZOFRAN-ODT) disintegrating tablet 4 mg (has no administration in time range)    Or  ondansetron (ZOFRAN) injection 4 mg (has no administration in time range)  prochlorperazine (COMPAZINE) tablet 10 mg (has no administration in time range)    Or  prochlorperazine (COMPAZINE) injection 5-10 mg (has no administration in time range)  pantoprazole (PROTONIX) injection 40 mg (has no administration in time range)  ondansetron (ZOFRAN) injection 4 mg (4 mg Intravenous Given 09/03/22 0255)  lactated ringers bolus 1,000 mL (1,000 mLs Intravenous New Bag/Given 09/03/22 0412)  morphine (PF) 4 MG/ML injection 4 mg (4 mg Intravenous Given 09/03/22 0416)  ondansetron (ZOFRAN) injection 4 mg (4 mg Intravenous Given 09/03/22 0417)     IMPRESSION / MDM / ASSESSMENT AND PLAN / ED COURSE  I reviewed the triage vital signs and the nursing notes.  Differential diagnosis includes, but is not limited to, ACS, PTX, PNA, muscle strain/spasm, PE,  dissection, anxiety, pleural effusion, esophagitis, biliary colic, cholecystitis  {Patient presents with symptoms of an acute illness or injury that is potentially life-threatening.  42 year old woman presents to the ED with evidence of cholecystitis requiring surgical admission.  Leukocytosis is noted but no other SIRS criteria to suggest sepsis.  She has localized tenderness without tenderness peritoneal features.  Blood work with essentially normal LFTs.  AST is noted to be around 60 but alk phos, bilirubin are normal.  Lipase normal.  Cardiac workup is reassuring with negative troponin and reassuring EKG.  CXR is clear.  Ultrasound evidence of cholecystitis and it fits the clinical picture.  Admitted to surgery  Clinical Course as of 09/03/22 0428  Thu Sep 03, 2022  1610 I consult with Dr. Everlene Farrier. He agrees to admit [DS]  Clinical Course User Index [DS] [name], MD     FINAL CLINICAL IMPRESSION(S) / ED DIAGNOSES   Final diagnoses:  Projectile vomiting with nausea  Right lower quadrant pain  RLQ abdominal pain     Rx / DC Orders   ED Discharge Orders     None        Note:  This document was prepared using Dragon voice recognition software and may include unintentional dictation errors.   [name], MD 09/03/22 [phone]

## 2022-09-03 NOTE — Anesthesia Preprocedure Evaluation (Addendum)
Anesthesia Evaluation  Patient identified by MRN, date of birth, ID band Patient awake    Reviewed: Allergy & Precautions, NPO status , Patient's Chart, lab work & pertinent test results  History of Anesthesia Complications Negative for: history of anesthetic complications  Airway Mallampati: IV   Neck ROM: Full    Dental  (+) Caps   Pulmonary neg pulmonary ROS   Pulmonary exam normal breath sounds clear to auscultation       Cardiovascular Exercise Tolerance: Good Normal cardiovascular exam Rhythm:Regular Rate:Normal  ECG 09/03/22: normal   Neuro/Psych  PSYCHIATRIC DISORDERS Anxiety Depression    negative neurological ROS     GI/Hepatic S/p esophageal dilations   Endo/Other  Obesity   Renal/GU Renal disease (nephrolithiasis)     Musculoskeletal   Abdominal   Peds  Hematology negative hematology ROS (+)   Anesthesia Other Findings   Reproductive/Obstetrics Endometriosis                              Anesthesia Physical Anesthesia Plan  ASA: 2  Anesthesia Plan: General   Post-op Pain Management:    Induction: Intravenous  PONV Risk Score and Plan: 3 and Ondansetron, Dexamethasone and Treatment may vary due to age or medical condition  Airway Management Planned: Oral ETT  Additional Equipment:   Intra-op Plan:   Post-operative Plan: Extubation in OR  Informed Consent: I have reviewed the patients History and Physical, chart, labs and discussed the procedure including the risks, benefits and alternatives for the proposed anesthesia with the patient or authorized representative who has indicated his/her understanding and acceptance.     Dental advisory given  Plan Discussed with: CRNA  Anesthesia Plan Comments: (Patient consented for risks of anesthesia including but not limited to:  - adverse reactions to medications - damage to eyes, teeth, lips or other oral mucosa -  nerve damage due to positioning  - sore throat or hoarseness - damage to heart, brain, nerves, lungs, other parts of body or loss of life  Informed patient about role of CRNA in peri- and intra-operative care.  Patient voiced understanding.)        Anesthesia Quick Evaluation

## 2022-09-03 NOTE — Op Note (Signed)
Robotic assisted laparoscopic Cholecystectomy  Pre-operative Diagnosis: cholecystitis  Post-operative Diagnosis: same  Procedure:  Robotic assisted laparoscopic Cholecystectomy  Surgeon: Sterling Big, MD FACS  Anesthesia: Gen. with endotracheal tube  Findings: Acute Cholecystitis   Estimated Blood Loss: 10cc       Specimens: Gallbladder           Complications: none   Procedure Details  The patient was seen again in the Holding Room. The benefits, complications, treatment options, and expected outcomes were discussed with the patient. The risks of bleeding, infection, recurrence of symptoms, failure to resolve symptoms, bile duct damage, bile duct leak, retained common bile duct stone, bowel injury, any of which could require further surgery and/or ERCP, stent, or papillotomy were reviewed with the patient. The likelihood of improving the patient's symptoms with return to their baseline status is good.  The patient and/or family concurred with the proposed plan, giving informed consent.  The patient was taken to Operating Room, identified  and the procedure verified as Laparoscopic Cholecystectomy.  A Time Out was held and the above information confirmed.  Prior to the induction of general anesthesia, antibiotic prophylaxis was administered. VTE prophylaxis was in place. General endotracheal anesthesia was then administered and tolerated well. After the induction, the abdomen was prepped with Chloraprep and draped in the sterile fashion. The patient was positioned in the supine position.  Cut down technique was used to enter the abdominal cavity and a Hasson trochar was placed after two vicryl stitches were anchored to the fascia. Pneumoperitoneum was then created with CO2 and tolerated well without any adverse changes in the patient's vital signs.  Three 8-mm ports were placed under direct vision. All skin incisions  were infiltrated with a local anesthetic agent before making the  incision and placing the trocars.   The patient was positioned  in reverse Trendelenburg, robot was brought to the surgical field and docked in the standard fashion.  We made sure all the instrumentation was kept indirect view at all times and that there were no collision between the arms. I scrubbed out and went to the console.  The gallbladder was identified, the fundus grasped and retracted cephalad. Adhesions were lysed bluntly. The infundibulum was grasped and retracted laterally, exposing the peritoneum overlying the triangle of Calot. This was then divided and exposed in a blunt fashion. An extended critical view of the cystic duct and cystic artery was obtained.  The cystic duct was clearly identified and bluntly dissected.   Artery and duct were double clipped and divided. Using ICG cholangiography we visualize the cystic duct and CBD w/o evidence of bile injuries. The gallbladder was taken from the gallbladder fossa in a retrograde fashion with the electrocautery.  Hemostasis was achieved with the electrocautery. nspection of the right upper quadrant was performed. No bleeding, bile duct injury or leak, or bowel injury was noted. Robotic instruments and robotic arms were undocked in the standard fashion.  I scrubbed back in.  The gallbladder was removed and placed in an Endocatch bag.   Pneumoperitoneum was released.  The periumbilical port site was closed with interrumpted 0 Vicryl sutures. 4-0 subcuticular Monocryl was used to close the skin. Dermabond was  applied.  The patient was then extubated and brought to the recovery room in stable condition. Sponge, lap, and needle counts were correct at closure and at the conclusion of the case.               Sterling Big, MD, FACS

## 2022-09-03 NOTE — Anesthesia Procedure Notes (Signed)
Procedure Name: Intubation Date/Time: 09/03/2022 4:11 PM  Performed by: Morene Crocker, CRNAPre-anesthesia Checklist: Patient identified, Patient being monitored, Timeout performed, Emergency Drugs available and Suction available Patient Re-evaluated:Patient Re-evaluated prior to induction Oxygen Delivery Method: Circle system utilized Preoxygenation: Pre-oxygenation with 100% oxygen Induction Type: IV induction Ventilation: Mask ventilation without difficulty Laryngoscope Size: 3 and McGraph Grade View: Grade I Tube type: Oral Tube size: 7.0 mm Number of attempts: 1 Airway Equipment and Method: Stylet Placement Confirmation: ETT inserted through vocal cords under direct vision, positive ETCO2 and breath sounds checked- equal and bilateral Secured at: 22 cm Tube secured with: Tape Dental Injury: Teeth and Oropharynx as per pre-operative assessment  Comments: Smooth, atraumatic intubation, no complications noted

## 2022-09-03 NOTE — Progress Notes (Signed)
Patient woke up post-procedure stating that she had bruising on her eye that was not there previously. She is requesting that a that a day team member address.

## 2022-09-03 NOTE — ED Triage Notes (Signed)
Pt presents to ER with c/o chest pain in the center of her chest that radiates into her abdomen.  Pt states pain started yesterday afternoon, and has been constant, and getting worse.  Pt denies any cardiac hx, or issues with her gall bladder.  Pt endorses some n/v with the pain.  Pt is otherwise A&O x4 in triage, but appears to be in pain.

## 2022-09-03 NOTE — H&P (Signed)
[facility] (cpt [phone])  HISTORY OF PRESENT ILLNESS (HPI):  42 y.o. female presented to [facility] ED overnight for abdominal pain. Patient reports the acute onset of epigastric pain since Monday night. She has had a lot of personal stressors and figured it was related to this. Unfortunately, the pain continued and worsened. This was accompanied with chills and nausea. No fever, cough, SOB, CP, emesis, or bowel changes. No history of similar in the past. Does have previous intra-abdominal surgeries to include abdominal hysterectomy, oophorectomy, appendectomy, and C-section x2. Work up in the ED revealed leukocytosis to 19.6K and CMP was otherwise reassuring. RUQ [place] was concerning for cholecystitis.   General surgery is consulted by emergency medicine physician [name], MD for evaluation and management of acute cholecystitis.   PAST MEDICAL HISTORY (PMH):  Past Medical History:  Diagnosis Date   Chest pain    Endometriosis    Family history of adverse reaction to anesthesia    Family history of heart disease    History of kidney stones 01/2021   Scoliosis     Reviewed. Otherwise negative.   PAST SURGICAL HISTORY (PSH):  Past Surgical History:  Procedure Laterality Date   ABDOMINAL HYSTERECTOMY     APPENDECTOMY     BACK SURGERY     Harrington Rod placement   BIOPSY  08/19/2021   Procedure: BIOPSY;  Surgeon: Kathi Der, MD;  Location: [facility];  Service: Gastroenterology;;   CESAREAN SECTION     x2   CYSTOCELE REPAIR N/A 06/26/2021   Procedure: ANTERIOR REPAIR (CYSTOCELE);  Surgeon: Nadara Mustard, MD;  Location: [facility];  Service: Gynecology;  Laterality: N/A;   CYSTOSCOPY N/A 06/26/2021   Procedure: CYSTOSCOPY;  Surgeon: Nadara Mustard, MD;  Location: [facility] OR[facility];  Service: Gynecology;  Laterality: N/A;   endometrial surgery     ESOPHAGOGASTRODUODENOSCOPY N/A 08/19/2021   Procedure: ESOPHAGOGASTRODUODENOSCOPY (EGD);  Surgeon:  Kathi Der, MD;  Location: [facility];  Service: Gastroenterology;  Laterality: N/A;  possible w/ dilation   FOOT SURGERY     KNEE SURGERY     LAPAROSCOPY N/A 07/13/2021   Procedure: LAPAROSCOPY DIAGNOSTIC;  Surgeon: Nadara Mustard, MD;  Location: [facility];  Service: Gynecology;  Laterality: N/A;   OOPHORECTOMY      Reviewed. Otherwise negative.   MEDICATIONS:  Prior to Admission medications   Medication Sig Start Date End Date Taking? Authorizing Provider  escitalopram (LEXAPRO) 10 MG tablet Take 10 mg by mouth daily. 09/01/22  Yes [provider]  predniSONE (DELTASONE) 10 MG tablet Take 1 tablet by mouth as directed. 08/27/22  Yes [provider]  cephALEXin (KEFLEX) 500 MG capsule Take 1 capsule (500 mg total) by mouth 4 (four) times daily. Patient not taking: Reported on 09/03/2022 06/05/22   Raspet, Noberto Retort, PA-C  HYDROcodone-acetaminophen (NORCO/VICODIN) 5-325 MG tablet Take 1 tablet by mouth 2 (two) times daily as needed. Patient not taking: Reported on 09/03/2022 06/05/22   Raspet, Noberto Retort, PA-C  FLUoxetine (PROZAC) 10 MG capsule  07/06/18 07/23/19  [provider]     ALLERGIES:  Allergies  Allergen Reactions   Zolpidem     Other Reaction(s): family history of sleep activity  does not want this     SOCIAL HISTORY:  Social History   Socioeconomic History   Marital status: Married    Spouse name: Not on file   Number of children: Not on file   Years of education: Not on file  Highest education level: Not on file  Occupational History   Not on file  Tobacco Use   Smoking status: Never   Smokeless tobacco: Never  Vaping Use   Vaping Use: Never used  Substance and Sexual Activity   Alcohol use: No   Drug use: No   Sexual activity: Yes    Birth control/protection: Surgical    Comment: Hysterectomy  Other Topics Concern   Not on file  Social History Narrative   Not on file   Social Determinants of Health   Financial Resource Strain:  Not on file  Food Insecurity: Not on file  Transportation Needs: Not on file  Physical Activity: Not on file  Stress: Not on file  Social Connections: Not on file  Intimate Partner Violence: Not on file     FAMILY HISTORY:  Family History  Problem Relation Age of Onset   Hypertension Mother    Cancer Father        bladder   Heart disease Father    Breast cancer Neg Hx     Otherwise negative.   REVIEW OF SYSTEMS:  Review of Systems  Constitutional:  Positive for chills and diaphoresis. Negative for fever.  Respiratory:  Negative for cough and shortness of breath.   Cardiovascular:  Negative for chest pain and palpitations.  Gastrointestinal:  Positive for abdominal pain and nausea. Negative for constipation, diarrhea and vomiting.  Genitourinary:  Negative for dysuria and urgency.  All other systems reviewed and are negative.   VITAL SIGNS:  Temp:  [97.8 F (36.6 C)] 97.8 F (36.6 C) (05/09 0239) Pulse Rate:  [68] 68 (05/09 0239) Resp:  [20] 20 (05/09 0239) BP: (122)/(71) 122/71 (05/09 0239) SpO2:  [99 %] 99 % (05/09 0239) Weight:  [88 kg] 88 kg (05/09 0240)     Height: 5\' 5"  (165.1 cm) Weight: 88 kg BMI (Calculated): 32.28   PHYSICAL EXAM:  Physical Exam Vitals and nursing note reviewed. Exam conducted with a chaperone present.  Constitutional:      General: She is not in acute distress.    Appearance: Normal appearance. She is obese. She is not ill-appearing.     Comments: Patient resting bed; NAD. Husband at bedside   HENT:     Head: Normocephalic and atraumatic.  Eyes:     General: No scleral icterus.    Conjunctiva/sclera: Conjunctivae normal.  Cardiovascular:     Rate and Rhythm: Normal rate.     Pulses: Normal pulses.     Heart sounds: No murmur heard. Pulmonary:     Effort: Pulmonary effort is normal. No respiratory distress.     Breath sounds: Normal breath sounds.  Abdominal:     General: A surgical scar is present. There is no distension.      Palpations: Abdomen is soft.     Tenderness: There is abdominal tenderness in the right upper quadrant and epigastric area. There is no guarding or rebound.     Comments: Abdomen is soft, tenderness with palpation to the epigastrium and RUQ, non-distended, no rebound/guarding   Genitourinary:    Comments: Deferred Musculoskeletal:     Right lower leg: No edema.     Left lower leg: No edema.  Skin:    General: Skin is warm and dry.     Coloration: Skin is not jaundiced.     Findings: No erythema.  Neurological:     General: No focal deficit present.     Mental Status: She is alert and oriented to  person, place, and time.  Psychiatric:        Mood and Affect: Mood normal.        Behavior: Behavior normal.     INTAKE/OUTPUT:  This shift: No intake/output data recorded.  Last 2 shifts: @IOLAST2SHIFTS @  Labs:     Latest Ref Rng & Units 09/03/2022    5:03 AM 09/03/2022    3:00 AM 08/18/2021    2:50 PM  CBC  WBC 4.0 - 10.5 K/uL 19.6  17.3  10.0   Hemoglobin 12.0 - 15.0 g/dL 82.9  56.2  13.0   Hematocrit 36.0 - 46.0 % 44.7  40.6  41.4   Platelets 150 - 400 K/uL 305  306  347       Latest Ref Rng & Units 09/03/2022    5:03 AM 09/03/2022    3:00 AM 08/19/2021    4:49 AM  CMP  Glucose 70 - 99 mg/dL  865  92   BUN 6 - 20 mg/dL  12  9   Creatinine 7.84 - 1.00 mg/dL 6.96  2.95  2.84   Sodium 135 - 145 mmol/L  136  138   Potassium 3.5 - 5.1 mmol/L  3.6  3.8   Chloride 98 - 111 mmol/L  101  105   CO2 22 - 32 mmol/L  25  26   Calcium 8.9 - 10.3 mg/dL  8.7  8.9   Total Protein 6.5 - 8.1 g/dL  7.0    Total Bilirubin 0.3 - 1.2 mg/dL  0.6    Alkaline Phos 38 - 126 U/L  69    AST 15 - 41 U/L  64    ALT 0 - 44 U/L  39      Imaging studies:   RUQ [place] (09/03/2022) personally reviewed with gallstones and inflammatory changes concerning for cholecystitis, and radiologist report reviewed below:  IMPRESSION: 1. Cholelithiasis with findings suspicious for acute cholecystitis. A hepatobiliary  scintigraphy may provide better evaluation of the gallbladder if there is a high clinical concern for acute cholecystitis . 2. Fatty liver.   Assessment/Plan: (ICD-10's: K81.0) 42 y.o. female with acute cholecystitis   - Will plan on robotic assisted laparoscopic cholecystectomy this afternoon with Dr Everlene Farrier pending OR/anesthesia availability  - All risks, benefits, and alternatives to above procedure(s) were discussed with the patient and her family, all of their questions were answered to their expressed satisfaction, patient expresses she wishes to proceed, and informed consent was obtained.    - NPO + IVF support - IV Abx (Zosyn) - ICG On call to OR - Monitor abdominal examination - Pain control prn; antiemetics prn   - Mobilize as tolerated   - DVT prophylaxis; hold for OR  All of the above findings and recommendations were discussed with the patient and her family, and all of their questions were answered to their expressed satisfaction.  -- Lynden Oxford, PA-C [facility] 09/03/2022, 7:57 AM M-F: 7am - 4pm

## 2022-09-03 NOTE — ED Notes (Signed)
Last po fluid and food intake 09/02/2022 @ 2000

## 2022-09-03 NOTE — Transfer of Care (Signed)
Immediate Anesthesia Transfer of Care Note  Patient: Crystal Pearson  Procedure(s) Performed: XI ROBOTIC ASSISTED LAPAROSCOPIC CHOLECYSTECTOMY INDOCYANINE GREEN FLUORESCENCE IMAGING (ICG)  Patient Location: PACU  Anesthesia Type:General  Level of Consciousness: drowsy and responds to stimulation  Airway & Oxygen Therapy: Patient Spontanous Breathing and Patient connected to face mask oxygen  Post-op Assessment: Report given to RN and Post -op Vital signs reviewed and stable  Post vital signs: Reviewed and stable  Last Vitals:  Vitals Value Taken Time  BP 155/91 09/03/22 1717  Temp    Pulse 88 09/03/22 1721  Resp 17 09/03/22 1721  SpO2 100 % 09/03/22 1721  Vitals shown include unvalidated device data.  Last Pain:  Vitals:   09/03/22 1437  TempSrc:   PainSc: 4          Complications: No notable events documented.

## 2022-09-03 NOTE — ED Notes (Signed)
Pt ambulated to and from bathroom with independent and steady gait.  

## 2022-09-04 MED ORDER — ONDANSETRON 4 MG PO TBDP
4.0000 mg | ORAL_TABLET | Freq: Four times a day (QID) | ORAL | 0 refills | Status: DC | PRN
Start: 2022-09-04 — End: 2023-03-02

## 2022-09-04 MED ORDER — IBUPROFEN 800 MG PO TABS: 800.0000 mg | ORAL_TABLET | Freq: Three times a day (TID) | ORAL | 0 refills | Status: DC | PRN

## 2022-09-04 MED ORDER — OXYCODONE HCL 5 MG PO TABS
5.0000 mg | ORAL_TABLET | Freq: Four times a day (QID) | ORAL | 0 refills | Status: DC | PRN
Start: 2022-09-04 — End: 2022-09-24

## 2022-09-04 NOTE — Discharge Summary (Signed)
[facility] SURGICAL DISCHARGE SUMMARY  Patient ID: Crystal Pearson MRN: [id number] DOB/AGE: 05-31-1980 42 y.o.  Admit date: 09/03/2022 Discharge date: 09/04/2022  Discharge Diagnoses Patient Active Problem List   Diagnosis Date Noted   Cholecystitis 09/03/2022    Consultants None  Procedures 09/03/2022:  Robotic assisted laparoscopic cholecystectomy  HPI: 42 y.o. female presented to [facility] ED overnight for abdominal pain. Patient reports the acute onset of epigastric pain since Monday night. She has had a lot of personal stressors and figured it was related to this. Unfortunately, the pain continued and worsened. This was accompanied with chills and nausea. No fever, cough, SOB, CP, emesis, or bowel changes. No history of similar in the past. Does have previous intra-abdominal surgeries to include abdominal hysterectomy, oophorectomy, appendectomy, and C-section x2. Work up in the ED revealed leukocytosis to 19.6K and CMP was otherwise reassuring. RUQ [place] was concerning for cholecystitis.   Hospital Course: Informed consent was obtained and documented, and patient underwent uneventful robotic assisted laparoscopic cholecystectomy (Dr Everlene Farrier, 09/03/2022).  Post-operatively, patient's pain/symptoms improved/resolved and advancement of patient's diet and ambulation were well-tolerated. The remainder of patient's hospital course was essentially unremarkable, and discharge planning was initiated accordingly with patient safely able to be discharged home with appropriate discharge instructions, pain control, and outpatient follow-up after all of her questions were answered to her expressed satisfaction.   Discharge Condition: Good   Physical Examination:  Constitutional: Well appearing female, NAD HEENT: Ecchymosis to the right eye; EOM intact Pulmonary: Normal effort, no respiratory distress Gastrointestinal: Soft, incisional soreness, non-distended, no rebound/guarding Skin:  Laparoscopic incisions are CDI with dermabond; no erythema or drainage    Allergies as of 09/04/2022       Reactions   Zolpidem    Other Reaction(s): family history of sleep activity  does not want this        Medication List     STOP taking these medications    cephALEXin 500 MG capsule Commonly known as: KEFLEX   HYDROcodone-acetaminophen 5-325 MG tablet Commonly known as: NORCO/VICODIN       TAKE these medications    escitalopram 10 MG tablet Commonly known as: LEXAPRO Take 10 mg by mouth daily.   ibuprofen 800 MG tablet Commonly known as: ADVIL Take 1 tablet (800 mg total) by mouth every 8 (eight) hours as needed.   ondansetron 4 MG disintegrating tablet Commonly known as: ZOFRAN-ODT Take 1 tablet (4 mg total) by mouth every 6 (six) hours as needed for nausea.   oxyCODONE 5 MG immediate release tablet Commonly known as: Oxy IR/ROXICODONE Take 1 tablet (5 mg total) by mouth every 6 (six) hours as needed for severe pain or breakthrough pain.   predniSONE 10 MG tablet Commonly known as: DELTASONE Take 1 tablet by mouth as directed.          Follow-up Information     Donovan Kail, PA-C. Schedule an appointment as soon as possible for a visit in 3 week(s).   Specialty: Physician Assistant Why: s/p laparoscopic cholecystectomy Contact information: [address] [place] [address] [phone]                  Time spent on discharge management including discussion of hospital course, clinical condition, outpatient instructions, prescriptions, and follow up with the patient and members of the medical team: >30 minutes  -- Lynden Oxford , PA-C [facility]  09/04/2022, 8:20 AM [phone] M-F: 7am - 4pm

## 2022-09-04 NOTE — Anesthesia Postprocedure Evaluation (Signed)
Anesthesia Post Note  Patient: Crystal Pearson  Procedure(s) Performed: XI ROBOTIC ASSISTED LAPAROSCOPIC CHOLECYSTECTOMY INDOCYANINE GREEN FLUORESCENCE IMAGING (ICG)  Patient location during evaluation: Nursing Unit Anesthesia Type: General Level of consciousness: awake and alert Pain management: pain level controlled Vital Signs Assessment: post-procedure vital signs reviewed and stable Respiratory status: spontaneous breathing, nonlabored ventilation, respiratory function stable and patient connected to nasal cannula oxygen Cardiovascular status: blood pressure returned to baseline and stable Postop Assessment: no apparent nausea or vomiting Anesthetic complications: no Comments: Patient with complaints of right "black eye". There is an area of ecchymosis under the right eye and some mild periorbital swelling, which the patient notes was not present preop. She denies any vision changes or symptoms of corneal abrasion. There were no documented issues during the anesthetic, and the eyes were appropriately taped. Of note, she did have poison oak on her face 2-3 weeks ago, treated with steroids and resolved. I have no clear answer for the cause of this, but I reassured patient that since she does not have any vision changes or concerning ocular symptoms, her bruise should resolve like any other bruise. I offered my sympathies. She understands.   No notable events documented.   Last Vitals:  Vitals:   09/04/22 0357 09/04/22 0854  BP: 133/77 (!) 141/86  Pulse: 65 70  Resp: 18 18  Temp: 36.5 C 36.6 C  SpO2: 99% 98%    Last Pain:  Vitals:   09/04/22 0854  TempSrc: Oral  PainSc:                  Corinda Gubler

## 2022-09-04 NOTE — Discharge Instructions (Signed)
In addition to included general post-operative instructions,  Diet: Resume home diet. Recommend avoiding or limiting fatty/greasy foods over the next few days/week. If you do eat these, you may (or may not) notice diarrhea. This is expected while your body adjusts to not having a gallbladder, and it typically resolves with time.    Activity: No heavy lifting >25 pounds (children, pets, laundry, garbage) or strenuous activity for 4 weeks, but light activity and walking are encouraged. Do not drive or drink alcohol if taking narcotic pain medications or having pain that might distract from driving.  Wound care: 2 days after surgery (05/11), you may shower/get incision wet with soapy water and pat dry (do not rub incisions), but no baths or submerging incision underwater until follow-up.   Medications: Resume all home medications. For mild to moderate pain: acetaminophen (Tylenol) or ibuprofen/naproxen (if no kidney disease). Combining Tylenol with alcohol can substantially increase your risk of causing liver disease. Narcotic pain medications, if prescribed, can be used for severe pain, though may cause nausea, constipation, and drowsiness. Do not combine Tylenol and Percocet (or similar) within a 6 hour period as Percocet (and similar) contain(s) Tylenol. If you do not need the narcotic pain medication, you do not need to fill the prescription.  Call office [phone] / [phone]) at any time if any questions, worsening pain, fevers/chills, bleeding, drainage from incision site, or other concerns.

## 2022-09-04 NOTE — Progress Notes (Signed)
  Transition of Care [facility]) Screening Note   Patient Details  Name: Crystal Pearson Date of Birth: January 28, 1981   Transition of Care [facility]) CM/SW Contact:    Chapman Fitch, RN Phone Number: 09/04/2022, 9:36 AM    Transition of Care Department [facility]) has reviewed patient and no TOC needs have been identified at this time. We will continue to monitor patient advancement through interdisciplinary progression rounds. If new patient transition needs arise, please place a TOC consult.

## 2022-09-07 LAB — SURGICAL PATHOLOGY

## 2022-09-09 ENCOUNTER — Telehealth: Payer: Self-pay | Admitting: *Deleted

## 2022-09-09 NOTE — Telephone Encounter (Signed)
Faxed FMLA to [facility] to [phone] and [facility] [phone]

## 2022-09-15 ENCOUNTER — Other Ambulatory Visit: Payer: Self-pay | Admitting: *Deleted

## 2022-09-15 MED ORDER — IBUPROFEN 800 MG PO TABS: 800.0000 mg | ORAL_TABLET | Freq: Three times a day (TID) | ORAL | 0 refills | Status: DC | PRN

## 2022-09-16 ENCOUNTER — Telehealth: Payer: Self-pay | Admitting: Surgery

## 2022-09-16 NOTE — Telephone Encounter (Signed)
Patient had robotic cholecystectomy done on 09/03/22 with Dr. Everlene Farrier.  She has a few questions, first when will she be able to get in water via bathtub and beach?  When can she be sexually active? And still having some pain at surgical site, is this normal? Other than that, all is good, no fever or chills, no nausea or vomiting.  Please call her. Thank you.

## 2022-09-16 NOTE — Telephone Encounter (Signed)
Patient made aware that she may not submerge the incisions until they are fully healed. May want to wait another week for sexual activity so more comfortable. Is making use of ice and heat and ibuprofen. Will call back with any further questions.

## 2022-09-24 ENCOUNTER — Ambulatory Visit (INDEPENDENT_AMBULATORY_CARE_PROVIDER_SITE_OTHER): Payer: BC Managed Care – PPO | Admitting: Physician Assistant

## 2022-09-24 ENCOUNTER — Encounter: Payer: Self-pay | Admitting: Physician Assistant

## 2022-09-24 VITALS — BP 120/82 | HR 105 | Temp 98.2°F | Ht 65.0 in | Wt 196.2 lb

## 2022-09-24 DIAGNOSIS — K810 Acute cholecystitis: Secondary | ICD-10-CM

## 2022-09-24 DIAGNOSIS — Z09 Encounter for follow-up examination after completed treatment for conditions other than malignant neoplasm: Secondary | ICD-10-CM

## 2022-09-24 DIAGNOSIS — K819 Cholecystitis, unspecified: Secondary | ICD-10-CM

## 2022-09-24 MED ORDER — CYCLOBENZAPRINE HCL 5 MG PO TABS: 5.0000 mg | ORAL_TABLET | Freq: Every day | ORAL | 0 refills | Status: DC

## 2022-09-24 NOTE — Patient Instructions (Signed)

## 2022-09-24 NOTE — Progress Notes (Signed)
[facility] POST-OP OFFICE VISIT  09/24/2022  HPI: Crystal Pearson is a 42 y.o. female 21 days s/p robotic assisted laparoscopic cholecystectomy for acute cholecystitis with Dr Everlene Farrier  She is doing okay Still with umbilical and right port site soreness; worse with deep inspiration Some nausea and bloating with eating Constipated but taking laxatives, which help Incisions are well healed Ambulating well No other complaints   Vital signs: BP 120/82   Pulse (!) 105   Temp 98.2 F (36.8 C) (Oral)   Ht 5\' 5"  (1.651 m)   Wt 196 lb 3.2 oz (89 kg)   SpO2 96%   BMI 32.65 kg/m    Physical Exam: Constitutional: Well appearing female, NAD Abdomen: Soft, umbilical and right port site soreness, non-distended, no rebound/guarding Skin: Laparoscopic incisions are healing well, no erythema or drainage; there is healing ecchymosis expectedly   Assessment/Plan: This is a 42 y.o. female 21 days s/p robotic assisted laparoscopic cholecystectomy for acute cholecystitis with Dr Everlene Farrier   - Pain control prn; will trial flexeril (5mg  x10 pills) QHS for pain and to help her rest. Continue OTC modalities   - Reviewed wound care recommendation  - Reviewed lifting restrictions; She may stay out of work until 06/17 to allow for adequate recovery from surgery  - Reviewed surgical pathology; [facility]  - She can follow up on as needed basis; She understands to call with questions/concerns  -- Lynden Oxford, PA-C [facility] 09/24/2022, 2:20 PM M-F: 7am - 4pm

## 2022-10-07 ENCOUNTER — Telehealth: Payer: Self-pay | Admitting: Surgery

## 2022-10-07 NOTE — Telephone Encounter (Signed)
Patient calls, she last saw in office on 09/24/22 for her post op.  She had robotic cholecystectomy done on 09/03/22 with Dr. Everlene Farrier.  Patient states that her testing came back negative for yeast infection and no UTI.  She has continued with her Cipro.  She continues to have pain right lower side of her back and incisional area.  She says is tender to the touch when palpating that area.  There is no redness or drainage at the site.  Still having a lot of pain and discomfort.  Please call her to advise.  Thank you.

## 2022-10-08 NOTE — Telephone Encounter (Signed)
Left a message for the patient to call back to schedule a post op appointment with Dr Everlene Farrier for Monday for him to reassess her. She may use a heating pad to the area in the mean time for comfort.

## 2022-10-12 ENCOUNTER — Encounter: Payer: Self-pay | Admitting: Surgery

## 2022-10-12 ENCOUNTER — Ambulatory Visit (INDEPENDENT_AMBULATORY_CARE_PROVIDER_SITE_OTHER): Payer: BC Managed Care – PPO | Admitting: Surgery

## 2022-10-12 ENCOUNTER — Other Ambulatory Visit: Payer: Self-pay | Admitting: Obstetrics & Gynecology

## 2022-10-12 VITALS — BP 134/85 | HR 78 | Temp 98.2°F | Ht 65.0 in | Wt 194.0 lb

## 2022-10-12 DIAGNOSIS — K810 Acute cholecystitis: Secondary | ICD-10-CM

## 2022-10-12 DIAGNOSIS — Z09 Encounter for follow-up examination after completed treatment for conditions other than malignant neoplasm: Secondary | ICD-10-CM

## 2022-10-12 DIAGNOSIS — R1031 Right lower quadrant pain: Secondary | ICD-10-CM

## 2022-10-12 DIAGNOSIS — Z1231 Encounter for screening mammogram for malignant neoplasm of breast: Secondary | ICD-10-CM

## 2022-10-12 NOTE — Progress Notes (Signed)
Crystal Pearson is 5 weeks out from robotic cholecystectomy.  Overall she is doing better but she persists with lower abdominal pain.  No fevers no chills.  She does have some multiple GI issues.  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  Please note that I have personally review both the CT scan from last year and the EGD.  She also had a gastric emptying study due to her persistent nausea last year.  Please note that this all was preoperatively. He now reports some lower abdominal pain that is persistent and persistent nausea. Note she did have small ecchymoses on the right I following surgery.  She did at that time had poison oak.  Thought that this very small and mild ecchymosis on the right upper eyelid was likely related to tape.  I do not think this was related to a major injury from a robotic approach. He has recovered from that small ecchymosis on the right eyelid.  PE NAD HEENT: No evidence of orbital or eyelid hematomas. EOMI Abd: Soft incisions healed.  Mild lower abdominal tenderness without peritonitis.  Evidence of abdominal wall hernias  A/P abdominal pain with nausea.  S/p rob chole 5 weeks ago. She did have baseline GI issues preoperatively. Though I do not think this lower abdominal pain and nausea is necessarily related to her recent surgery I need to prove that there are no complications or new associated pathology.  Will go ahead and obtain a CT scan of the abdomen pelvis and see her in follow-up in a few weeks. For hospitalization or admission at this time

## 2022-10-12 NOTE — Patient Instructions (Addendum)
Your CT is scheduled for 10/16/2022 12:30 pm (arrive by 10:30 am to drink contrast) at [facility] [address].    If you have any concerns or questions, please feel free to call our office.   Abdominal Pain, Adult  Many things can cause belly (abdominal) pain. In most cases, belly pain is not a serious problem and can be watched and treated at home. But in some cases, it can be serious. Your doctor will try to find the cause of your belly pain. Follow these instructions at home: Medicines Take over-the-counter and prescription medicines only as told by your doctor. Do not take medicines that help you poop (laxatives) unless told by your doctor. General instructions Watch your belly pain for any changes. Tell your doctor if the pain gets worse. Drink enough fluid to keep your pee (urine) pale yellow. Contact a doctor if: Your belly pain changes or gets worse. You have very bad cramping or bloating in your belly. You vomit. Your pain gets worse with meals, after eating, or with certain foods. You have trouble pooping or have watery poop for more than 2-3 days. You are not hungry, or you lose weight without trying. You have signs of not getting enough fluid or water (dehydration). These may include: Dark pee, very little pee, or no pee. Cracked lips or dry mouth. Feeling sleepy or weak. You have pain when you pee or poop. Your belly pain wakes you up at night. You have blood in your pee. You have a fever. Get help right away if: You cannot stop vomiting. Your pain is only in one part of your belly, like on the right side. You have bloody or black poop, or poop that looks like tar. You have trouble breathing. You have chest pain. These symptoms may be an emergency. Get help right away. Call 911. Do not wait to see if the symptoms will go away. Do not drive yourself to the hospital. This information is not intended to replace advice given to you by your health care  provider. Make sure you discuss any questions you have with your health care provider. Document Revised: 01/28/2022 Document Reviewed: 01/28/2022 Elsevier Patient Education  2024 [employer].

## 2022-10-15 ENCOUNTER — Other Ambulatory Visit: Payer: Self-pay

## 2022-10-15 ENCOUNTER — Emergency Department (HOSPITAL_COMMUNITY)
Admission: EM | Admit: 2022-10-15 | Discharge: 2022-10-16 | Disposition: A | Discharge status: Home / Self Care | Payer: BC Managed Care – PPO | Attending: Emergency Medicine | Admitting: Emergency Medicine

## 2022-10-15 ENCOUNTER — Ambulatory Visit (HOSPITAL_COMMUNITY)
Admission: EM | Admit: 2022-10-15 | Discharge: 2022-10-15 | Disposition: A | Discharge status: Home / Self Care | Payer: BC Managed Care – PPO

## 2022-10-15 DIAGNOSIS — R1084 Generalized abdominal pain: Secondary | ICD-10-CM | POA: Insufficient documentation

## 2022-10-15 DIAGNOSIS — R109 Unspecified abdominal pain: Secondary | ICD-10-CM | POA: Diagnosis present

## 2022-10-15 DIAGNOSIS — R110 Nausea: Secondary | ICD-10-CM | POA: Insufficient documentation

## 2022-10-15 LAB — CBC
HCT: 42.9 % (ref 36.0–46.0)
Hemoglobin: 13.5 g/dL (ref 12.0–15.0)
MCH: 27.4 pg (ref 26.0–34.0)
MCHC: 31.5 g/dL (ref 30.0–36.0)
MCV: 87 fL (ref 80.0–100.0)
Platelets: 333 10*3/uL (ref 150–400)
RBC: 4.93 MIL/uL (ref 3.87–5.11)
RDW: 13.7 % (ref 11.5–15.5)
WBC: 8.6 10*3/uL (ref 4.0–10.5)
nRBC: 0 % (ref 0.0–0.2)

## 2022-10-15 LAB — COMPREHENSIVE METABOLIC PANEL
ALT: 21 U/L (ref 0–44)
AST: 24 U/L (ref 15–41)
Albumin: 4.6 g/dL (ref 3.5–5.0)
Alkaline Phosphatase: 72 U/L (ref 38–126)
Anion gap: 8 (ref 5–15)
BUN: 11 mg/dL (ref 6–20)
CO2: 25 mmol/L (ref 22–32)
Calcium: 9.5 mg/dL (ref 8.9–10.3)
Chloride: 104 mmol/L (ref 98–111)
Creatinine, Ser: 1 mg/dL (ref 0.44–1.00)
GFR, Estimated: >60 mL/min (ref >60)
Glucose, Bld: 91 mg/dL (ref 70–99)
Potassium: 3.7 mmol/L (ref 3.5–5.1)
Sodium: 137 mmol/L (ref 135–145)
Total Bilirubin: 0.5 mg/dL (ref 0.3–1.2)
Total Protein: 8.1 g/dL (ref 6.5–8.1)

## 2022-10-15 LAB — LIPASE, BLOOD: Lipase: 30 U/L (ref 11–51)

## 2022-10-15 MED ORDER — ONDANSETRON HCL 4 MG/2ML IJ SOLN
4.0000 mg | Freq: Once | INTRAMUSCULAR | Status: AC
  Administered 2022-10-16: 4 mg via INTRAVENOUS
  Filled 2022-10-15: qty 2

## 2022-10-15 MED ORDER — MORPHINE SULFATE (PF) 2 MG/ML IV SOLN
2.0000 mg | Freq: Once | INTRAVENOUS | Status: AC
  Administered 2022-10-16: 2 mg via INTRAVENOUS
  Filled 2022-10-15: qty 1

## 2022-10-15 NOTE — ED Notes (Signed)
Patient is being discharged from the Urgent Care and sent to the Emergency Department via POV . Per [facility], patient is in need of higher level of care due to limited resources. Patient is aware and verbalizes understanding of plan of care.  Vitals:   10/15/22 1956  BP: (!) 144/95  Pulse: 78  Resp: 20  Temp: 97.9 F (36.6 C)  SpO2: 100%

## 2022-10-15 NOTE — ED Provider Notes (Signed)
Seen briefly in triage Gallbladder removed last month Persisting lower abd pain and nausea Saw PCP 3 days ago who ordered CT scan, scheduled for tomorrow. Came to urgent care because pain is worse. Rates 8/10  Sent to ED for imaging. Discussed urgent care does not have resources to evaluate.    Crystal Pearson, Ray Church 10/15/22 2001

## 2022-10-15 NOTE — ED Provider Notes (Addendum)
[place] EMERGENCY DEPARTMENT AT [facility] Provider Note   CSN: [id number] Arrival date & time: 10/15/22  2016     History  Chief Complaint  Patient presents with  . Abdominal Pain    Crystal Pearson is a 42 y.o. female with history of robotic cholecystectomy on 5/9 with history of paraesophageal hernia type III and esophagitis who presents with concern for right-sided flank pain that radiates to the right upper quadrant and bilateral lower abdomen with associated nausea, without vomiting or diarrhea.  Endorses some constipation requiring laxatives for regular bowel movement.  No dysuria or hematuria.  No fevers but endorses chills.  States the pain has been present since her surgery but is progressively worsening and tentative 10 today prompting urgent care visit.  Does have CT ordered from her surgeon Dr. Everlene Farrier and at [facility], scheduled for tomorrow.  I personally reviewed medical records.  She is history of cystocele dysphagia and esophagitis as well as gastric ulcers.  History of kidney stones.  No anticoagulation.  HPI     Home Medications Prior to Admission medications   Medication Sig Start Date End Date Taking? Authorizing Provider  ciprofloxacin (CIPRO) 500 MG tablet Take 500 mg by mouth 2 (two) times daily.   Yes [provider]  escitalopram (LEXAPRO) 10 MG tablet Take 10 mg by mouth daily. 09/01/22  Yes [provider]  HYDROcodone-acetaminophen (NORCO/VICODIN) 5-325 MG tablet Take 2 tablets by mouth every 4 (four) hours as needed. 10/16/22  Yes Navreet Bolda R, PA-C  cyclobenzaprine (FLEXERIL) 5 MG tablet Take 1 tablet (5 mg total) by mouth at bedtime. Patient not taking: Reported on 10/16/2022 09/24/22   Donovan Kail, PA-C  ibuprofen (ADVIL) 800 MG tablet Take 1 tablet (800 mg total) by mouth every 8 (eight) hours as needed. Patient not taking: Reported on 10/16/2022 09/15/22   Donovan Kail, PA-C  ondansetron (ZOFRAN-ODT) 4 MG  disintegrating tablet Take 1 tablet (4 mg total) by mouth every 6 (six) hours as needed for nausea. Patient not taking: Reported on 10/16/2022 09/04/22   Donovan Kail, PA-C  FLUoxetine (PROZAC) 10 MG capsule  07/06/18 07/23/19  [provider]      Allergies    Zolpidem    Review of Systems   Review of Systems  Constitutional:  Positive for appetite change and chills. Negative for activity change, fatigue and fever.  HENT: Negative.    Respiratory: Negative.    Cardiovascular: Negative.   Gastrointestinal:  Positive for abdominal pain and nausea. Negative for diarrhea and vomiting.  Genitourinary:  Positive for flank pain. Negative for decreased urine volume, dysuria, frequency, menstrual problem, urgency, vaginal bleeding, vaginal discharge and vaginal pain.  Skin: Negative.     Physical Exam Updated Vital Signs BP (!) 146/98   Pulse 68   Temp 97.8 F (36.6 C) (Oral)   Resp 17   SpO2 100%  Physical Exam Vitals and nursing note reviewed.  Constitutional:      Appearance: She is obese. She is not ill-appearing or toxic-appearing.  HENT:     Head: Normocephalic and atraumatic.     Mouth/Throat:     Mouth: Mucous membranes are moist.     Pharynx: No oropharyngeal exudate or posterior oropharyngeal erythema.  Eyes:     General:        Right eye: No discharge.        Left eye: No discharge.     Conjunctiva/sclera: Conjunctivae normal.  Cardiovascular:  Rate and Rhythm: Normal rate and regular rhythm.     Pulses: Normal pulses.     Heart sounds: Normal heart sounds. No murmur heard. Pulmonary:     Effort: Pulmonary effort is normal. No respiratory distress.     Breath sounds: Normal breath sounds. No wheezing or rales.  Abdominal:     General: A surgical scar is present. Bowel sounds are normal. There is no distension.     Palpations: Abdomen is soft.     Tenderness: There is abdominal tenderness in the right upper quadrant, right lower quadrant, epigastric  area and periumbilical area. There is right CVA tenderness. There is no guarding.  Musculoskeletal:        General: No deformity.     Cervical back: Neck supple.  Skin:    General: Skin is warm and dry.     Capillary Refill: Capillary refill takes less than 2 seconds.  Neurological:     General: No focal deficit present.     Mental Status: She is alert and oriented to person, place, and time. Mental status is at baseline.  Psychiatric:        Mood and Affect: Mood normal.     ED Results / Procedures / Treatments   Labs (all labs ordered are listed, but only abnormal results are displayed) Labs Reviewed  LIPASE, BLOOD  COMPREHENSIVE METABOLIC PANEL  CBC  URINALYSIS, ROUTINE W REFLEX MICROSCOPIC    EKG None  Radiology CT ABDOMEN PELVIS W CONTRAST  Result Date: 10/16/2022 CLINICAL DATA:  Right upper quadrant abdominal pain. EXAM: CT ABDOMEN AND PELVIS WITH CONTRAST TECHNIQUE: Multidetector CT imaging of the abdomen and pelvis was performed using the standard protocol following bolus administration of intravenous contrast. RADIATION DOSE REDUCTION: This exam was performed according to the departmental dose-optimization program which includes automated exposure control, adjustment of the mA and/or kV according to patient size and/or use of iterative reconstruction technique. CONTRAST:  OMNIPAQUE IOHEXOL 300 MG/ML  SOLN COMPARISON:  CT abdomen pelvis dated 07/13/2021. FINDINGS: Lower chest: The visualized lung bases are clear. No intra-abdominal free air or free fluid. Hepatobiliary: The liver is unremarkable. No biliary dilatation. Cholecystectomy. Pancreas: Unremarkable. No pancreatic ductal dilatation or surrounding inflammatory changes. Spleen: Normal in size without focal abnormality. Adrenals/Urinary Tract: The adrenal glands are unremarkable. Small right renal interpolar cyst as seen on the prior CT. There is no hydronephrosis on either side. The visualized ureters and urinary  bladder appear unremarkable. Stomach/Bowel: There is sigmoid diverticulosis without active inflammatory changes. There is a 2 cm area of apparent enhancement or contrast blush involving the proximal transverse colon (32/10, 52/6) which may represent a focus of bleed, possibly related to a diverticula. An enhancing mass is not excluded. Clinical correlation recommended to evaluate for blood in stool. Dedicated CT angiography for GI bleed or colonoscopy may provide better evaluation. There is sigmoid and distal colonic diverticulosis without active inflammatory changes. There is loose stool in the proximal colon. There is no bowel obstruction. The appendix is not visualized with certainty. No inflammatory changes identified in the right lower quadrant. Vascular/Lymphatic: The abdominal aorta and IVC are unremarkable. No portal venous gas. There is no adenopathy. Reproductive: The uterus is retroverted.  No adnexal masses. Other: Small fat containing umbilical hernia. Musculoskeletal: Degenerative changes of the spine and scoliosis. Spinal Harrington rods. No acute osseous pathology. IMPRESSION: 1. Findings concerning for GI bleed involving the proximal transverse colon in the right upper abdomen versus an enhancing colonic mass. Clinical correlation and  further evaluation with dedicated GI bleed CTA or colonoscopy recommended. 2. Colonic diverticulosis. No bowel obstruction. Electronically Signed   By: Elgie Collard M.D.   On: 10/16/2022 00:54    Procedures Procedures    Medications Ordered in ED Medications  HYDROcodone-acetaminophen (NORCO/VICODIN) 5-325 MG per tablet 1 tablet (has no administration in time range)  ondansetron (ZOFRAN) injection 4 mg (4 mg Intravenous Given 10/16/22 0028)  morphine (PF) 2 MG/ML injection 2 mg (2 mg Intravenous Given 10/16/22 0028)  iohexol (OMNIPAQUE) 300 MG/ML solution 100 mL (100 mLs Intravenous Contrast Given 10/16/22 0038)    ED Course/ Medical Decision Making/  A&P Clinical Course as of 10/16/22 0420  Fri Oct 16, 2022  0216 Consult with Dr. Lillia Mountain, radiologist to further discuss patient CT imaging.  He states that this appears to be completely unrelated to her recent surgical procedure.  Does agree with recommendation for outpatient colonoscopy but does not feel CTA would add meaningfully to workup tonight.  I appreciate his collaboration of care of this patient. [RS]  [id number] Patient was put up for discharge, however I was informed by ED RN that the patient states that she is spoken with Dr. Levora Angel her gastroenterologist since my last discussion with her. She states that the gastroenterologist has stated that she should be admitted to the hospital to have her colonoscopy performed in the next 24 hours.  I personally reached out to Dr. Rich Brave office and spoke with him directly on the phone; he states that this was not his direction. There is no indication for admission and he states that his office will reach out to the patient today to schedule outpatient colonoscopy likely sometime next week.  I appreciate his clarification and collaboration in the care of this patient. [RS]    Clinical Course User Index [RS] Abilene Mcphee, Eugene Gavia, PA-C                             Medical Decision Making 42 year old female status post robotic cholecystectomy approximately 6 weeks ago who presents with concern for worsening right flank and upper quadrant pain with associated nausea.  Afebrile.  Hypertensive on intake, vitals otherwise normal.  Cardiopulmonary and abdominal exams as above with right-sided CVA tenderness and right upper quadrant tenderness palpation without rebound.  The differential diagnosis for RUQ includes but is not limited to:  Cholelithiasis / choledocholithiasis / cholecystitis / cholangitis, hepatitis (eg. viral, alcoholic, toxic),liver abscess, pancreatitis, liver / pancreatic / biliary tract cancer, ischemic hepatopathy (shock liver),  hepatic vein obstruction (Budd-Chiari syndrome), liver cell adenoma, peptic ulcer disease (duodenal), functional or nonulcer dyspepsia, right lower lobe pneumonia, pyelonephritis, urinary calculi,  Fitz-Hugh-Curtis syndrome (with pelvic inflammatory disease), herpes zoster, trauma or musculoskeletal pain, herniated disk, abdominal abscess, intestinal ischemia, physical or sexual abuse,ectopic pregnancy, IUP, Mittelschmerz, ovarian cyst/torsion, threatened/ievitable abortion, PID, endometriosis, molar pregnancy, heterotopic pregnancy, corpus luteum cyst, appendicitis, UTI/renal colic, IBD.    Amount and/or Complexity of Data Reviewed Labs: ordered.    Details: CBC and CMP unremarkable, lipase is normal.  Patient status post hysterectomy. Radiology: ordered.    Details: CT scan with concern for enhancing Colonic mass in the transverse colon, possible blush.  Please see timestamp discussion with radiologist as above for further insight into this read.  Risk Prescription drug management.   Patient without evidence of acute GI bleed on history or physical exam.  Patient without melena, hematochezia, anemia, tachycardia, or risk factors for GI bleeding.  Per  discussion with radiologist and review of patient's CT scan feel her pain is likely related to today's identified colonic mass.  Will provide prescription for outpatient and oral analgesia.  Secure chat message sent to Dr. Dulce Sellar who is on-call for [facility] gastroenterology.  Patient follows with Dr. Levora Angel.  Patient was instructed to call their office today to schedule colonoscopy as soon as possible.  At this time the exact etiology of her symptoms remains unclear to suspect is related to identified mass on CT imaging today.  No evidence of emergent etiology that would warrant further ED workup or inpatient management.  Patient is hemodynamically stable, well-appearing with improvement in pain following IV analgesia.  Novali voiced understanding of  her medical evaluation and treatment plan. Each of their questions answered to their expressed satisfaction.  Return precautions were given.  Patient is well-appearing, stable, and was discharged in good condition.  This chart was dictated using voice recognition software, Dragon. Despite the best efforts of this provider to proofread and correct errors, errors may still occur which can change documentation meaning.   Final Clinical Impression(s) / ED Diagnoses Final diagnoses:  Generalized abdominal pain    Rx / DC Orders ED Discharge Orders          Ordered    HYDROcodone-acetaminophen (NORCO/VICODIN) 5-325 MG tablet  Every 4 hours PRN        10/16/22 0303              Zayvien Canning, Eugene Gavia, PA-C 10/16/22 0329    Kharson Rasmusson, Eugene Gavia, PA-C 10/16/22 0420    Maia Plan, MD 10/16/22 [phone]

## 2022-10-15 NOTE — ED Notes (Signed)
Patient is having flank and abdominal pain, nausea.  Patient has had gallbladder surgery approximately a month ago and has had contact with provider in regards to possible infection.  Provider saw patient at intake

## 2022-10-15 NOTE — ED Triage Notes (Signed)
Pt reports abdominal pain and right sided flank pain that has been ongoing for the last week. Pt reports being seen at PCP and had negative urine test. Pt reports nausea and decreased appetite since gallbladder removal on 5/9. Pt reports increased pain with eating and pain has increased over the last week.

## 2022-10-16 ENCOUNTER — Emergency Department (HOSPITAL_COMMUNITY): Payer: BC Managed Care – PPO

## 2022-10-16 ENCOUNTER — Ambulatory Visit: Admission: RE | Admit: 2022-10-16 | Payer: BC Managed Care – PPO | Source: Ambulatory Visit

## 2022-10-16 MED ORDER — KETOROLAC TROMETHAMINE 15 MG/ML IJ SOLN
15.0000 mg | Freq: Once | INTRAMUSCULAR | Status: AC
  Administered 2022-10-16: 15 mg via INTRAVENOUS
  Filled 2022-10-16: qty 1

## 2022-10-16 MED ORDER — IOHEXOL 300 MG/ML  SOLN
100.0000 mL | Freq: Once | INTRAMUSCULAR | Status: AC | PRN
  Administered 2022-10-16: 100 mL via INTRAVENOUS

## 2022-10-16 MED ORDER — HYDROCODONE-ACETAMINOPHEN 5-325 MG PO TABS: 1.0000 | ORAL_TABLET | Freq: Once | ORAL | Status: DC

## 2022-10-16 MED ORDER — ONDANSETRON HCL 4 MG/2ML IJ SOLN
4.0000 mg | Freq: Once | INTRAMUSCULAR | Status: AC
  Administered 2022-10-16: 4 mg via INTRAVENOUS
  Filled 2022-10-16: qty 2

## 2022-10-16 MED ORDER — HYDROCODONE-ACETAMINOPHEN 5-325 MG PO TABS: 2.0000 | ORAL_TABLET | ORAL | 0 refills | Status: DC | PRN

## 2022-10-16 NOTE — Discharge Instructions (Addendum)
You are seen here today for your abdominal pain.  Your labs are reassuring.  Your CT scan was concerning for possible mass in your colon.  Please follow-up with your gastroenterologist for colonoscopy in the outpatient setting for further evaluation of this mass.  Return to the ER with any severe symptoms.

## 2022-10-21 ENCOUNTER — Encounter: Payer: BC Managed Care – PPO | Admitting: Surgery

## 2022-11-13 ENCOUNTER — Ambulatory Visit
Admission: RE | Admit: 2022-11-13 | Discharge: 2022-11-13 | Disposition: A | Discharge status: Home / Self Care | Payer: BC Managed Care – PPO | Source: Ambulatory Visit | Attending: Obstetrics & Gynecology | Admitting: Obstetrics & Gynecology

## 2022-11-13 DIAGNOSIS — Z1231 Encounter for screening mammogram for malignant neoplasm of breast: Secondary | ICD-10-CM

## 2023-02-21 ENCOUNTER — Encounter: Payer: Self-pay | Admitting: Emergency Medicine

## 2023-02-21 ENCOUNTER — Ambulatory Visit
Admission: EM | Admit: 2023-02-21 | Discharge: 2023-02-21 | Disposition: A | Discharge status: Home / Self Care | Payer: BC Managed Care – PPO | Attending: Physician Assistant | Admitting: Physician Assistant

## 2023-02-21 DIAGNOSIS — L03115 Cellulitis of right lower limb: Secondary | ICD-10-CM

## 2023-02-21 DIAGNOSIS — M25471 Effusion, right ankle: Secondary | ICD-10-CM | POA: Diagnosis not present

## 2023-02-21 DIAGNOSIS — W57XXXA Bitten or stung by nonvenomous insect and other nonvenomous arthropods, initial encounter: Secondary | ICD-10-CM

## 2023-02-21 DIAGNOSIS — S90561A Insect bite (nonvenomous), right ankle, initial encounter: Secondary | ICD-10-CM | POA: Diagnosis not present

## 2023-02-21 MED ORDER — TRIAMCINOLONE ACETONIDE 0.5 % EX OINT: 1.0000 | TOPICAL_OINTMENT | Freq: Two times a day (BID) | CUTANEOUS | 0 refills | Status: DC

## 2023-02-21 MED ORDER — HYDROXYZINE HCL 25 MG PO TABS: 25.0000 mg | ORAL_TABLET | Freq: Four times a day (QID) | ORAL | 0 refills | Status: DC | PRN

## 2023-02-21 MED ORDER — CEPHALEXIN 500 MG PO CAPS: 500.0000 mg | ORAL_CAPSULE | Freq: Four times a day (QID) | ORAL | 0 refills | Status: AC

## 2023-02-21 MED ORDER — DEXAMETHASONE SODIUM PHOSPHATE 10 MG/ML IJ SOLN
10.0000 mg | Freq: Once | INTRAMUSCULAR | Status: AC
  Administered 2023-02-21: 10 mg via INTRAMUSCULAR

## 2023-02-21 NOTE — ED Triage Notes (Signed)
Patient states that she redness, swelling and drainage from the area on her right ankle since Friday.  Patient thinks she got bit by something and now is infected.

## 2023-02-21 NOTE — Discharge Instructions (Addendum)
-  Elevate and ice extremity frequently, every couple of hours when possible. - We gave you an injection of a steroid and I sent a cream to the pharmacy for you. - Start anti-inflammatory medication such as ibuprofen tomorrow.  May also take Tylenol for pain. - I sent something for the itching. - If you feel that your redness, swelling, pain worsen please go to the ER.

## 2023-02-21 NOTE — ED Provider Notes (Signed)
[facility]    CSN: [id number] Arrival date & time: 02/21/23  1549      History   Chief Complaint Chief Complaint  Patient presents with   Insect Bite    Right ankle    HPI Crystal Pearson is a 42 y.o. female presenting for pain, redness and swelling of the right inner ankle x 2 days.  She reports that she was walking in an area with tall grass in shoes that exposed her ankle.  She reports noticing 2 red bumps that look like blood blisters when she got back to her vehicle.  Since then she has noticed redness and swelling of the inner ankle.  She reports that there has been pustular drainage and she is concerned about infection.  She does not know what bit her but thinks it could have been a baby snake.  She has been touching her ankle with cold fingers and elevating it.  Has also tried hydrocortisone cream.  HPI  Past Medical History:  Diagnosis Date   Chest pain    Endometriosis    Family history of adverse reaction to anesthesia    Family history of heart disease    History of kidney stones 01/2021   Scoliosis     Patient Active Problem List   Diagnosis Date Noted   Cholecystitis 09/03/2022   Mood disorder (HCC) 08/20/2021   Esophagitis, esophageal ulcer and gastric ulcer 08/20/2021   Esophageal ulcer 08/20/2021   Gastric ulcer 08/20/2021   Elevated BP without diagnosis of hypertension 08/19/2021   Nausea, vomiting and dysphagia 08/19/2021   Dysphagia 08/19/2021   Chest pain 08/18/2021   RLQ abdominal pain 07/13/2021   Right lower quadrant pain 07/13/2021   S/P anterior colporrhaphy 07/02/2021   Primary stress urinary incontinence 06/26/2021   Cystocele, midline 06/26/2021   Obesity (BMI 30.0-34.9) 07/11/2018   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    Past Surgical History:  Procedure Laterality Date   ABDOMINAL HYSTERECTOMY     APPENDECTOMY     BACK SURGERY     Harrington  Rod placement   BIOPSY  08/19/2021   Procedure: BIOPSY;  Surgeon: Kathi Der, MD;  Location: [facility];  Service: Gastroenterology;;   CESAREAN SECTION     x2   CYSTOCELE REPAIR N/A 06/26/2021   Procedure: ANTERIOR REPAIR (CYSTOCELE);  Surgeon: Nadara Mustard, MD;  Location: [facility];  Service: Gynecology;  Laterality: N/A;   CYSTOSCOPY N/A 06/26/2021   Procedure: CYSTOSCOPY;  Surgeon: Nadara Mustard, MD;  Location: [facility];  Service: Gynecology;  Laterality: N/A;   endometrial surgery     ESOPHAGOGASTRODUODENOSCOPY N/A 08/19/2021   Procedure: ESOPHAGOGASTRODUODENOSCOPY (EGD);  Surgeon: Kathi Der, MD;  Location: [facility];  Service: Gastroenterology;  Laterality: N/A;  possible w/ dilation   FOOT SURGERY     KNEE SURGERY     LAPAROSCOPY N/A 07/13/2021   Procedure: LAPAROSCOPY DIAGNOSTIC;  Surgeon: Nadara Mustard, MD;  Location: [facility];  Service: Gynecology;  Laterality: N/A;   OOPHORECTOMY      OB History     Gravida  2   Para  2   Term  0   Preterm  0   AB  0   Living  2      SAB  0   IAB  0   Ectopic  0   Multiple  0   Live Births  0  Home Medications    Prior to Admission medications   Medication Sig Start Date End Date Taking? Authorizing Provider  cephALEXin (KEFLEX) 500 MG capsule Take 1 capsule (500 mg total) by mouth 4 (four) times daily for 7 days. 02/21/23 02/28/23 Yes Eusebio Friendly B, PA-C  escitalopram (LEXAPRO) 10 MG tablet Take 10 mg by mouth daily. 09/01/22  Yes [provider]  hydrOXYzine (ATARAX) 25 MG tablet Take 1 tablet (25 mg total) by mouth every 6 (six) hours as needed for itching. 02/21/23  Yes Eusebio Friendly B, PA-C  triamcinolone ointment (KENALOG) 0.5 % Apply 1 Application topically 2 (two) times daily. 02/21/23  Yes Eusebio Friendly B, PA-C  ciprofloxacin (CIPRO) 500 MG tablet Take 500 mg by mouth 2 (two) times daily.    [provider]  cyclobenzaprine (FLEXERIL) 5 MG tablet Take 1  tablet (5 mg total) by mouth at bedtime. Patient not taking: Reported on 10/16/2022 09/24/22   Donovan Kail, PA-C  HYDROcodone-acetaminophen (NORCO/VICODIN) 5-325 MG tablet Take 2 tablets by mouth every 4 (four) hours as needed. 10/16/22   Sponseller, Lupe Carney R, PA-C  ibuprofen (ADVIL) 800 MG tablet Take 1 tablet (800 mg total) by mouth every 8 (eight) hours as needed. Patient not taking: Reported on 10/16/2022 09/15/22   Donovan Kail, PA-C  ondansetron (ZOFRAN-ODT) 4 MG disintegrating tablet Take 1 tablet (4 mg total) by mouth every 6 (six) hours as needed for nausea. Patient not taking: Reported on 10/16/2022 09/04/22   Donovan Kail, PA-C  FLUoxetine (PROZAC) 10 MG capsule  07/06/18 07/23/19  [provider]    Family History Family History  Problem Relation Age of Onset   Hypertension Mother    Cancer Father        bladder   Heart disease Father    Breast cancer Neg Hx     Social History Social History   Tobacco Use   Smoking status: Never   Smokeless tobacco: Never  Vaping Use   Vaping status: Never Used  Substance Use Topics   Alcohol use: No   Drug use: No     Allergies   Zolpidem   Review of Systems Review of Systems  Constitutional:  Negative for fatigue and fever.  Musculoskeletal:  Positive for arthralgias and joint swelling.  Skin:  Positive for color change and wound.  Neurological:  Negative for weakness and numbness.     Physical Exam Triage Vital Signs ED Triage Vitals  Encounter Vitals Group     BP 02/21/23 1600 134/88     Systolic BP Percentile --      Diastolic BP Percentile --      Pulse Rate 02/21/23 1600 79     Resp 02/21/23 1600 15     Temp 02/21/23 1600 98.9 F (37.2 C)     Temp Source 02/21/23 1600 Oral     SpO2 02/21/23 1600 98 %     Weight 02/21/23 1559 194 lb 0.1 oz (88 kg)     Height 02/21/23 1559 5\' 5"  (1.651 m)     Head Circumference --      Peak Flow --      Pain Score 02/21/23 1559 6     Pain Loc --       Pain Education --      Exclude from Growth Chart --    No data found.  Updated Vital Signs BP 134/88 (BP Location: Right Arm)   Pulse 79   Temp 98.9 F (37.2 C) (  Oral)   Resp 15   Ht 5\' 5"  (1.651 m)   Wt 194 lb 0.1 oz (88 kg)   SpO2 98%   BMI 32.28 kg/m     Physical Exam Vitals and nursing note reviewed.  Constitutional:      General: She is not in acute distress.    Appearance: Normal appearance. She is not ill-appearing or toxic-appearing.  HENT:     Head: Normocephalic and atraumatic.  Eyes:     General: No scleral icterus.       Right eye: No discharge.        Left eye: No discharge.     Conjunctiva/sclera: Conjunctivae normal.  Cardiovascular:     Rate and Rhythm: Normal rate.     Pulses: Normal pulses.  Pulmonary:     Effort: Pulmonary effort is normal. No respiratory distress.  Musculoskeletal:     Cervical back: Neck supple.  Skin:    General: Skin is dry.     Findings: Erythema and rash present.     Comments: There are 2 tiny puncture of the medial right ankle with surrounding erythema and mild swelling. Area is TTP  Neurological:     General: No focal deficit present.     Mental Status: She is alert. Mental status is at baseline.     Motor: No weakness.     Gait: Gait normal.  Psychiatric:        Mood and Affect: Mood normal.      UC Treatments / Results  Labs (all labs ordered are listed, but only abnormal results are displayed) Labs Reviewed - No data to display  EKG   Radiology No results found.  Procedures Procedures (including critical care time)  Medications Ordered in UC Medications  dexamethasone (DECADRON) injection 10 mg (has no administration in time range)    Initial Impression / Assessment and Plan / UC Course  I have reviewed the triage vital signs and the nursing notes.  Pertinent labs & imaging results that were available during my care of the patient were reviewed by me and considered in my medical decision making  (see chart for details).   42 year old female presents for 2 puncture wounds of medial right ankle with redness and swelling for the past couple of days.  She thinks she could have been bitten by babysitting but is not sure.  Has been elevating the extremity but the area continues to swell.  It is also very itchy despite using hydrocortisone cream.  On exam area is consistent with some sort of insect bite.  Baby snake bite not ruled out.  Reviewed cryotherapy, anti-inflammatory medication.  Patient would like a corticosteroid shot instead of oral medication.  Patient given 10 mg IM dexamethasone in clinic.  Sent Keflex to pharmacy for possible infection since she was reporting some pustular drainage.  Discussed elevation of extremity.  Reviewed close monitoring.  Discussed ER precautions.   Final Clinical Impressions(s) / UC Diagnoses   Final diagnoses:  Right ankle swelling  Insect bite of right ankle, initial encounter  Cellulitis of right lower extremity     Discharge Instructions      -Elevate and ice extremity frequently, every couple of hours when possible. - We gave you an injection of a steroid and I sent a cream to the pharmacy for you. - Start anti-inflammatory medication such as ibuprofen tomorrow.  May also take Tylenol for pain. - I sent something for the itching. - If you feel that your redness,  swelling, pain worsen please go to the ER.    ED Prescriptions     Medication Sig Dispense Auth. Provider   cephALEXin (KEFLEX) 500 MG capsule Take 1 capsule (500 mg total) by mouth 4 (four) times daily for 7 days. 28 capsule Eusebio Friendly B, PA-C   hydrOXYzine (ATARAX) 25 MG tablet Take 1 tablet (25 mg total) by mouth every 6 (six) hours as needed for itching. 30 tablet Eusebio Friendly B, PA-C   triamcinolone ointment (KENALOG) 0.5 % Apply 1 Application topically 2 (two) times daily. 30 g Gareth Morgan      PDMP not reviewed this encounter.   Shirlee Latch,  PA-C 02/21/23 [phone]

## 2023-03-02 ENCOUNTER — Ambulatory Visit: Payer: BC Managed Care – PPO | Admitting: Dermatology

## 2023-03-02 DIAGNOSIS — D492 Neoplasm of unspecified behavior of bone, soft tissue, and skin: Secondary | ICD-10-CM

## 2023-03-02 DIAGNOSIS — L814 Other melanin hyperpigmentation: Secondary | ICD-10-CM | POA: Diagnosis not present

## 2023-03-02 DIAGNOSIS — L821 Other seborrheic keratosis: Secondary | ICD-10-CM | POA: Diagnosis not present

## 2023-03-02 DIAGNOSIS — D485 Neoplasm of uncertain behavior of skin: Secondary | ICD-10-CM

## 2023-03-02 DIAGNOSIS — B078 Other viral warts: Secondary | ICD-10-CM | POA: Diagnosis not present

## 2023-03-02 NOTE — Patient Instructions (Addendum)
Recommend starting moisturizer with exfoliant (Urea, Salicylic acid, or Lactic acid) one to two times daily to help smooth rough and bumpy skin.  OTC options include Cetaphil Rough and Bumpy lotion (Urea), Eucerin Roughness Relief lotion or spot treatment cream (Urea), CeraVe SA lotion/cream for Rough and Bumpy skin (Sal Acid), Gold Bond Rough and Bumpy cream (Sal Acid), and AmLactin 12% lotion/cream (Lactic Acid).  If applying in morning, also apply sunscreen to sun-exposed areas, since these exfoliating moisturizers can increase sensitivity to sun.   Wound Care Instructions  Cleanse wound gently with soap and water once a day then pat dry with clean gauze. Apply a thin coat of Petrolatum (petroleum jelly, "Vaseline") over the wound (unless you have an allergy to this). We recommend that you use a new, sterile tube of Vaseline. Do not pick or remove scabs. Do not remove the yellow or white "healing tissue" from the base of the wound.  Cover the wound with fresh, clean, nonstick gauze and secure with paper tape. You may use Band-Aids in place of gauze and tape if the wound is small enough, but would recommend trimming much of the tape off as there is often too much. Sometimes Band-Aids can irritate the skin.  You should call the office for your biopsy report after 1 week if you have not already been contacted.  If you experience any problems, such as abnormal amounts of bleeding, swelling, significant bruising, significant pain, or evidence of infection, please call the office immediately.  FOR ADULT SURGERY PATIENTS: If you need something for pain relief you may take 1 extra strength Tylenol (acetaminophen) AND 2 Ibuprofen (200mg  each) together every 4 hours as needed for pain. (do not take these if you are allergic to them or if you have a reason you should not take them.) Typically, you may only need pain medication for 1 to 3 days.    Due to recent changes in healthcare laws, you may see results  of your pathology and/or laboratory studies on MyChart before the doctors have had a chance to review them. We understand that in some cases there may be results that are confusing or concerning to you. Please understand that not all results are received at the same time and often the doctors may need to interpret multiple results in order to provide you with the best plan of care or course of treatment. Therefore, we ask that you please give [place] 2 business days to thoroughly review all your results before contacting the office for clarification. Should we see a critical lab result, you will be contacted sooner.   If You Need Anything After Your Visit  If you have any questions or concerns for your doctor, please call our main line at [phone] and press option 4 to reach your doctor's medical assistant. If no one answers, please leave a voicemail as directed and we will return your call as soon as possible. Messages left after 4 pm will be answered the following business day.   You may also send [place] a message via MyChart. We typically respond to MyChart messages within 1-2 business days.  For prescription refills, please ask your pharmacy to contact our office. Our fax number is [phone].  If you have an urgent issue when the clinic is closed that cannot wait until the next business day, you can page your doctor at the number below.    Please note that while we do our best to be available for urgent issues outside of office  hours, we are not available 24/7.   If you have an urgent issue and are unable to reach [place], you may choose to seek medical care at your doctor's office, retail clinic, urgent care center, or emergency room.  If you have a medical emergency, please immediately call 911 or go to the emergency department.  Pager Numbers  - Dr. Gwen Pounds: [phone]  - Dr. Roseanne Reno: [phone]  - Dr. Katrinka Blazing: [phone]   In the event of inclement weather, please call our main line  at [phone] for an update on the status of any delays or closures.  Dermatology Medication Tips: Please keep the boxes that topical medications come in in order to help keep track of the instructions about where and how to use these. Pharmacies typically print the medication instructions only on the boxes and not directly on the medication tubes.   If your medication is too expensive, please contact our office at [phone] option 4 or send [place] a message through MyChart.   We are unable to tell what your co-pay for medications will be in advance as this is different depending on your insurance coverage. However, we may be able to find a substitute medication at lower cost or fill out paperwork to get insurance to cover a needed medication.   If a prior authorization is required to get your medication covered by your insurance company, please allow [place] 1-2 business days to complete this process.  Drug prices often vary depending on where the prescription is filled and some pharmacies may offer cheaper prices.  The website www.goodrx.com contains coupons for medications through different pharmacies. The prices here do not account for what the cost may be with help from insurance (it may be cheaper with your insurance), but the website can give you the price if you did not use any insurance.  - You can print the associated coupon and take it with your prescription to the pharmacy.  - You may also stop by our office during regular business hours and pick up a GoodRx coupon card.  - If you need your prescription sent electronically to a different pharmacy, notify our office through [facility] or by phone at [phone] option 4.     Si Usted Necesita Algo Despus de Su Visita  Tambin puede enviarnos un mensaje a travs de Clinical cytogeneticist. Por lo general respondemos a los mensajes de MyChart en el transcurso de 1 a 2 das hbiles.  Para renovar recetas, por favor pida a su farmacia que se  ponga en contacto con nuestra oficina. Annie Sable de fax es [place] [phone].  Si tiene un asunto urgente cuando la clnica est cerrada y que no puede esperar hasta el siguiente da hbil, puede llamar/localizar a su doctor(a) al nmero que aparece a continuacin.   Por favor, tenga en cuenta que aunque hacemos todo lo posible para estar disponibles para asuntos urgentes fuera del horario de [place], no estamos disponibles las 24 horas del da, los 7 [address]  [address] de la [place].   Si tiene un problema urgente y no puede comunicarse con nosotros, puede optar por buscar atencin mdica  en el consultorio de su doctor(a), en una clnica privada, en un centro de atencin urgente o en una sala de emergencias.  Si tiene Engineer, drilling, por favor llame inmediatamente al 911 o vaya a la sala de emergencias.  Nmeros de bper  - Dr. Gwen Pounds: [phone]  - Dra. Roseanne Reno: [phone]  - Dr. Katrinka Blazing: [phone]   En caso de  inclemencias del [place], por favor llame a nuestra lnea principal al [phone] para una actualizacin sobre el [place] de cualquier retraso o cierre.  Consejos para la medicacin en dermatologa: Por favor, guarde las cajas en las que vienen los medicamentos de uso tpico para ayudarle a seguir las instrucciones sobre dnde y cmo usarlos. Las farmacias generalmente imprimen las instrucciones del medicamento slo en las cajas y no directamente en los tubos del [place].   Si su medicamento es muy caro, por favor, pngase en contacto con Rolm [place]ala llamando al [phone] y presione la opcin 4 o envenos un mensaje a travs de Clinical cytogeneticist.   No podemos decirle cul ser su copago por los medicamentos por adelantado ya que esto es diferente dependiendo de la cobertura de su seguro. Sin embargo, es posible que podamos encontrar un medicamento sustituto a Audiological scientist un formulario para que el seguro cubra el medicamento que se considera necesario.   Si se requiere  una autorizacin previa para que su compaa de seguros [place] su medicamento, por favor permtanos de 1 a 2 das hbiles para completar [address]  Los precios de los medicamentos varan con frecuencia dependiendo del Environmental consultant de dnde se surte la receta y alguna farmacias pueden ofrecer precios ms baratos.  El sitio web www.goodrx.com tiene cupones para medicamentos de Health and safety inspector. Los precios aqu no tienen en cuenta lo que podra costar con la ayuda del seguro (puede ser ms barato con su seguro), pero el sitio web puede darle el precio si no utiliz Tourist information centre manager.  - Puede imprimir el cupn correspondiente y llevarlo con su receta a la farmacia.  - Tambin puede pasar por nuestra oficina durante el horario de atencin regular y Education officer, museum una tarjeta de cupones de GoodRx.  - Si necesita que su receta se enve electrnicamente a una farmacia diferente, informe a nuestra oficina a travs de MyChart de [place] o por telfono llamando al [phone] y presione la opcin 4.

## 2023-03-02 NOTE — Progress Notes (Signed)
   Follow-Up Visit   Subjective  Crystal Pearson is a 42 y.o. female who presents for the following: Skin tag of the groin area, present for years since her 20's no changes, but now it is more irritating. Also she has a growth on the flank at bra line.  The patient has spots, moles and lesions to be evaluated, some may be new or changing and the patient may have concern these could be cancer.   The following portions of the chart were reviewed this encounter and updated as appropriate: medications, allergies, medical history  Review of Systems:  No other skin or systemic complaints except as noted in HPI or Assessment and Plan.  Objective  Well appearing patient in no apparent distress; mood and affect are within normal limits.  A focused examination was performed of the following areas: Face, arms, legs, groin  Relevant physical exam findings are noted in the Assessment and Plan.  R upper vulva at labia majora 5.0 mm waxy pedunculated papule.    Assessment & Plan   Neoplasm of uncertain behavior of skin R upper vulva at labia majora  Epidermal / dermal shaving  Lesion diameter (cm):  0.5 Informed consent: discussed and consent obtained   Patient was prepped and draped in usual sterile fashion: Area prepped with alcohol. Anesthesia: the lesion was anesthetized in a standard fashion   Anesthetic:  1% lidocaine w/ epinephrine 1-100,000 buffered w/ 8.4% NaHCO3 Instrument used: flexible razor blade   Hemostasis achieved with: pressure, aluminum chloride and electrodesiccation   Outcome: patient tolerated procedure well   Post-procedure details: wound care instructions given   Post-procedure details comment:  Ointment and small bandage applied  Specimen 1 - Surgical pathology Differential Diagnosis: Inflamed SK vs Irritated Skin Tag Check Margins: No  SEBORRHEIC KERATOSIS - Stuck-on, waxy, tan-brown papules and/or plaques, left flank at bra line, arms, legs  -  Benign-appearing - Discussed benign etiology and prognosis. - Observe - Call for any changes  LENTIGINES Exam: scattered tan macules arms Due to sun exposure Treatment Plan: Benign-appearing, observe. Recommend daily broad spectrum sunscreen SPF 30+ to sun-exposed areas, reapply every 2 hours as needed.  Call for any changes   Return if symptoms worsen or fail to improve.  ICherlyn Labella, CMA, am acting as scribe for Willeen Niece, MD .   Documentation: I have reviewed the above documentation for accuracy and completeness, and I agree with the above.  Willeen Niece, MD

## 2023-03-05 LAB — SURGICAL PATHOLOGY

## 2023-03-08 ENCOUNTER — Telehealth: Payer: Self-pay

## 2023-03-08 NOTE — Telephone Encounter (Signed)
Patient advise of pathology results.

## 2023-03-08 NOTE — Telephone Encounter (Signed)
-----   Message from Willeen Niece sent at 03/08/2023 11:43 AM EST ----- 1. Skin, R upper vulva :       VERRUCA VULGARIS    Benign common wart, cryotherapy if it recurs - please call patient

## 2023-03-08 NOTE — Telephone Encounter (Signed)
Lft pt msg to call for bx results/sh °

## 2023-04-30 ENCOUNTER — Other Ambulatory Visit: Payer: Self-pay | Admitting: Orthopedic Surgery

## 2023-04-30 DIAGNOSIS — M545 Low back pain, unspecified: Secondary | ICD-10-CM

## 2023-04-30 DIAGNOSIS — M546 Pain in thoracic spine: Secondary | ICD-10-CM

## 2023-05-04 ENCOUNTER — Ambulatory Visit
Admission: RE | Admit: 2023-05-04 | Discharge: 2023-05-04 | Disposition: A | Discharge status: Home / Self Care | Payer: Self-pay | Source: Ambulatory Visit | Attending: Orthopedic Surgery | Admitting: Orthopedic Surgery

## 2023-05-04 DIAGNOSIS — M546 Pain in thoracic spine: Secondary | ICD-10-CM

## 2023-05-04 DIAGNOSIS — M545 Low back pain, unspecified: Secondary | ICD-10-CM

## 2023-05-11 ENCOUNTER — Inpatient Hospital Stay: Admission: RE | Admit: 2023-05-11 | Payer: 59 | Source: Ambulatory Visit

## 2023-05-12 IMAGING — CT CT ABD-PELV W/ CM
3 of 6 series · 15 of 46 positions shown, 17 images · IV contrast (agent unspecified)
Comparison: Preoperative CT 04/08/2021

CLINICAL DATA: Postoperative abdominal pain. Patient reports right
lower quadrant pain and nausea. Bladder sling surgery/cystocele
repair 06/26/2021.

EXAM:
CT ABDOMEN AND PELVIS WITH CONTRAST
TECHNIQUE: Multidetector CT imaging of the abdomen and pelvis was performed
using the standard protocol following bolus administration of
intravenous contrast.

[Series 3: abd pelvis (person_name) 5.00 · axial · 0.86mm/px · z∈[-1511,-1106]mm · 10 of 100 slices shown, 12 images]
[im 10/100  soft-tissue]
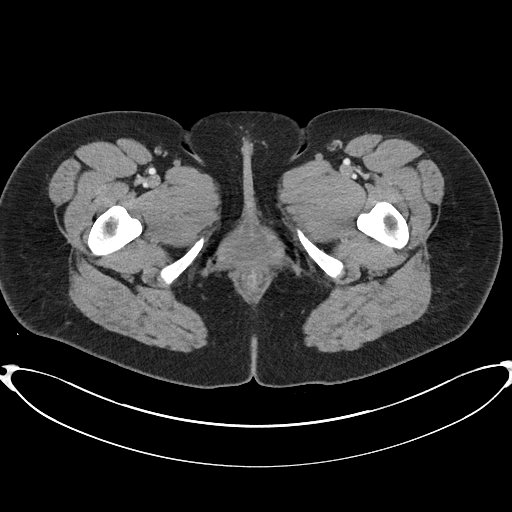
[im 10/100  bone]
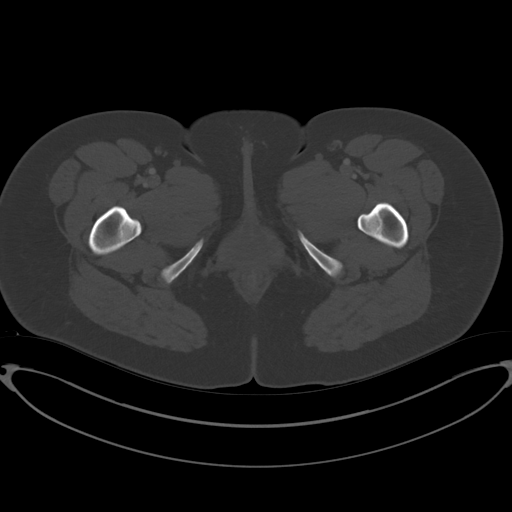
[im 19/100  soft-tissue]
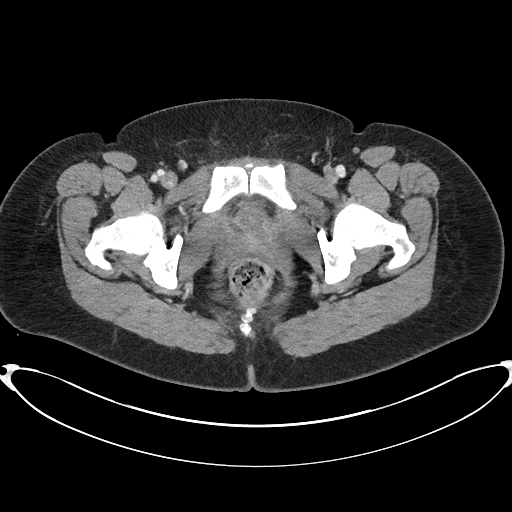
[im 28/100  soft-tissue]
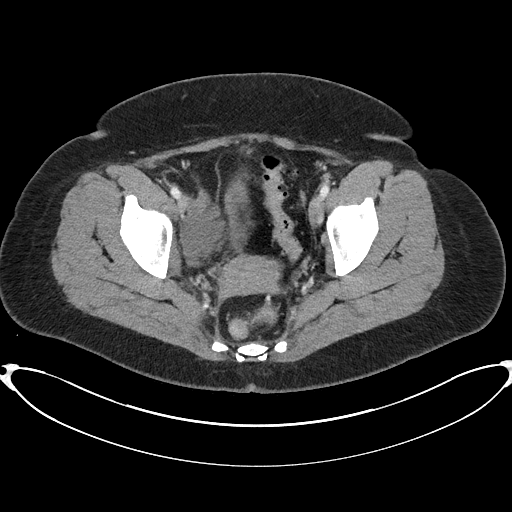
[im 37/100  soft-tissue]
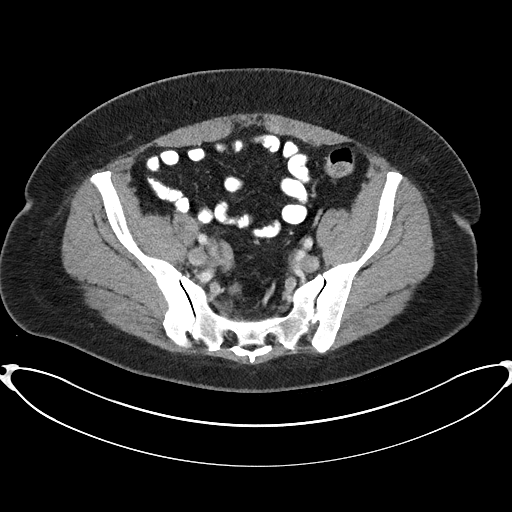
[im 46/100  soft-tissue]
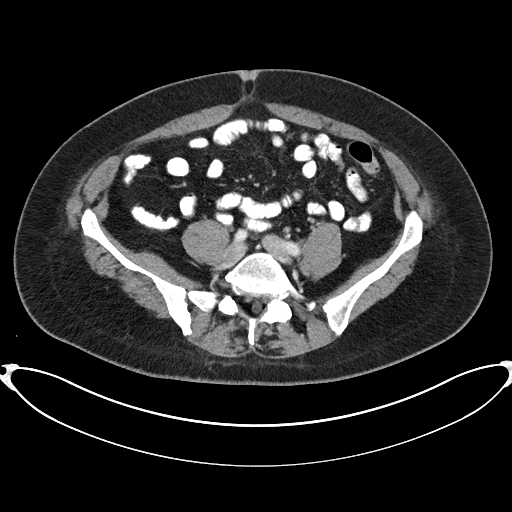
[im 55/100  soft-tissue]
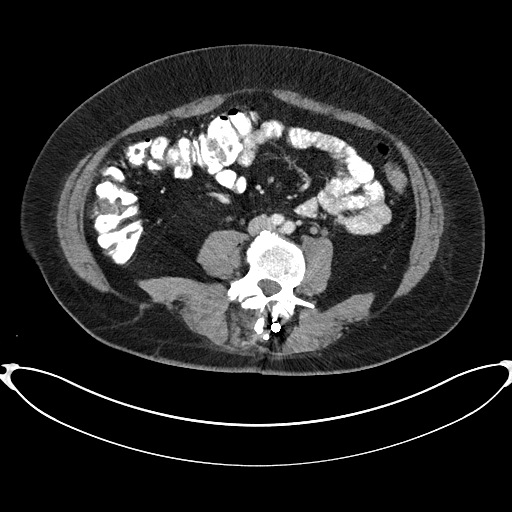
[im 64/100  soft-tissue]
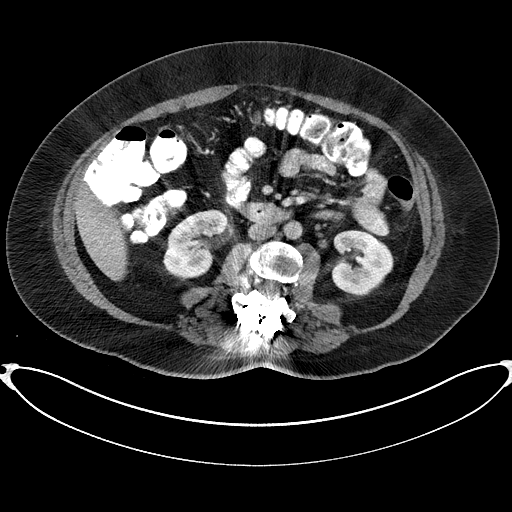
[im 73/100  soft-tissue]
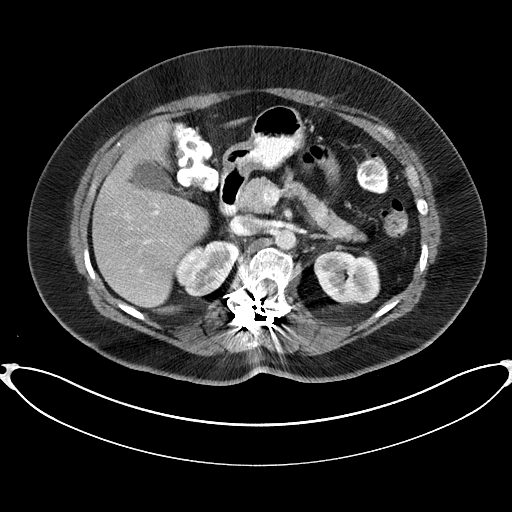
[im 82/100  soft-tissue]
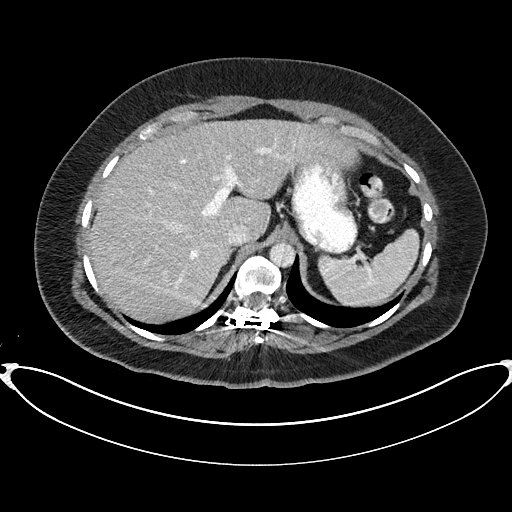
[im 82/100  bone]
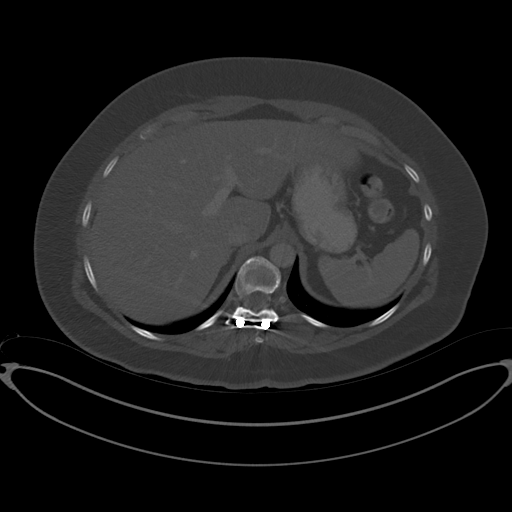
[im 91/100  soft-tissue]
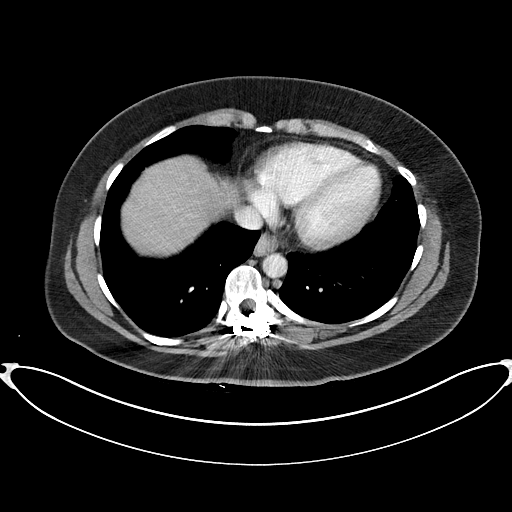

[Series 4: lungs abd pelvis 5.00 · axial · 0.86mm/px · z∈[-1181,-1121]mm · 2 of 37 slices shown]
[im 13/37  soft-tissue]
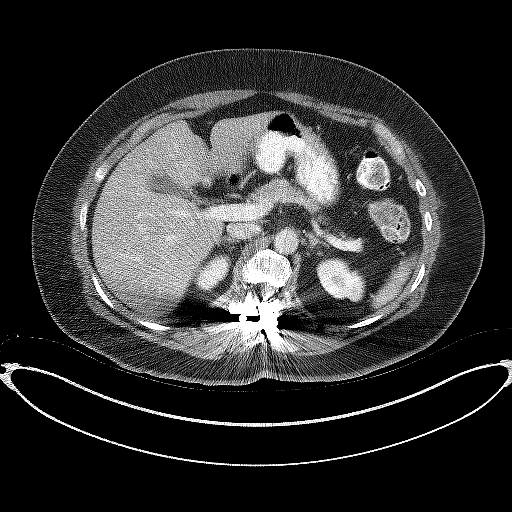
[im 25/37  soft-tissue]
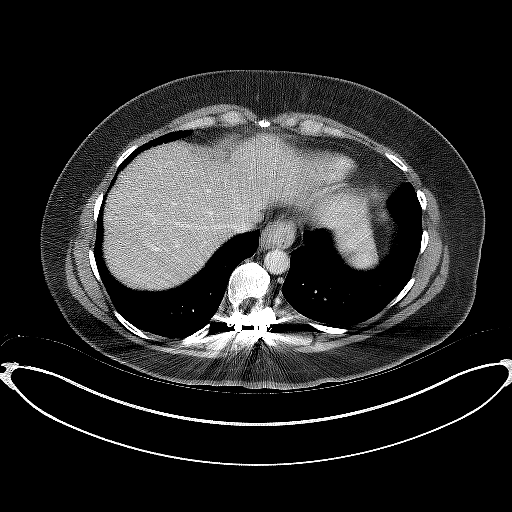

[Series 5: coronals abd pelvis 2.00 cor · coronal · 0.86mm/px · 3 of 154 slices shown]
[im 52/154  soft-tissue]
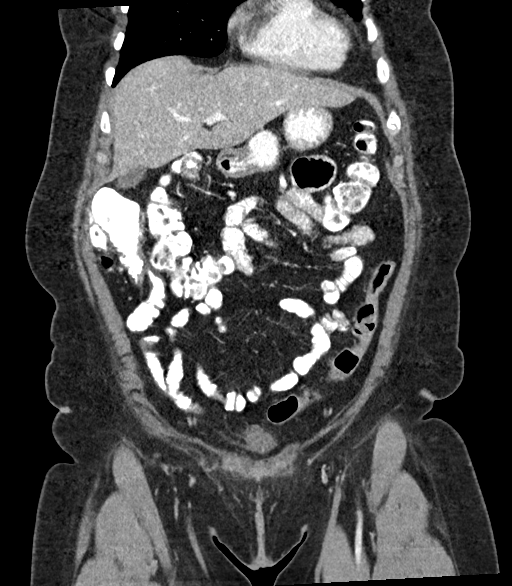
[im 69/154  soft-tissue]
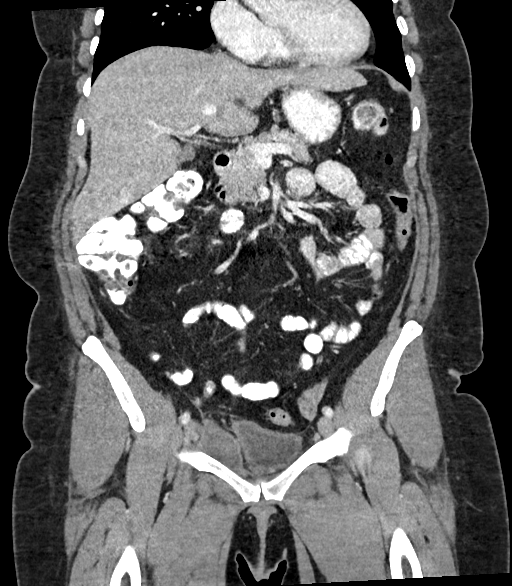
[im 86/154  soft-tissue]
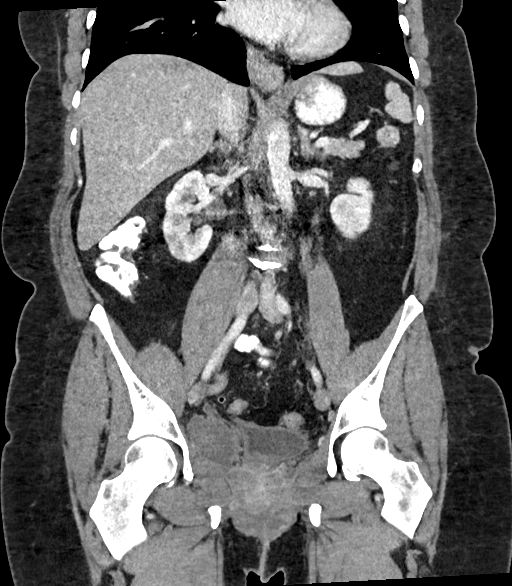

[15 of 46 positions shown; findings below may reference images not displayed]

RADIATION DOSE REDUCTION: This exam was performed according to the
departmental dose-optimization program which includes automated
exposure control, adjustment of the mA and/or kV according to
patient size and/or use of iterative reconstruction technique.

CONTRAST:  100mL OMNIPAQUE IOHEXOL 300 MG/ML  SOLN
FINDINGS: Lower chest: Clear lung bases. No focal airspace disease or pleural
effusion.

Hepatobiliary: Subjective hepatic steatosis, no focal liver lesion.
Gallbladder physiologically distended, no calcified stone. No
biliary dilatation.

Pancreas: No ductal dilatation or inflammation.

Spleen: Normal in size without focal abnormality.

Adrenals/Urinary Tract: Normal adrenal glands. No hydronephrosis.
Punctate stone in the mid left kidney, better appreciated on prior
exam. Slight lobulated bilateral renal contours. Right renal cysts
are similar to prior. No evidence of renal hemorrhage. There is
homogeneous enhancement with symmetric excretion on delayed phase
imaging. The urinary bladder is partially distended. There is an
ovoid fluid collection adjacent to the right lateral aspect of the
bladder measuring 7.3 x 3.9 cm. Internal density is 35 Hounsfield
units, higher than that of the adjacent bladder. A fat plane is seen
between this fluid collection on the bladder, although it causes
mild lateral bladder displacement. There is no discrete under
diverticulum or perivesicular free fluid.

Stomach/Bowel: Small hiatal hernia. The stomach is otherwise
unremarkable. Normal small bowel without obstruction or
inflammation. Appendectomy. History, the appendix is not seen.
High-riding cecum in the right mid abdomen. Colonic diverticulosis
of the sigmoid colon. No acute diverticulitis. No acute colonic
inflammation.

Vascular/Lymphatic: Normal caliber abdominal aorta. Patent portal,
splenic, and mesenteric veins. No enlarged lymph nodes in the
abdomen or pelvis.

Reproductive: Hysterectomy. Previous low-density within the central
vaginal cuff is no longer seen. The left ovary is visualized and
quiescent. The right ovary is not definitively seen.

Other: Ovoid fluid collection in the right low pelvis measures 7.3 x
3.9 cm, density of 35 Hounsfield units higher than that of adjacent
bladder. There is faint stranding about this fluid collection but no
significant peripheral enhancement or internal air. No pelvic free
fluid. Small fat containing umbilical hernia.

Musculoskeletal: Scoliosis with posterior Harrington rod and hook
fixation. There are no acute or suspicious osseous abnormalities.
IMPRESSION: 1. Ovoid fluid collection in the right low pelvis measuring 7.3 x
3.9 cm. This may represent a postoperative seroma or hematoma.
Sterility is indeterminate by imaging, and if patient has signs or
symptoms of infection, consider fluid sampling. This causes slight
mass effect on the urinary bladder with leftward deviation.
2. Colonic diverticulosis without acute inflammation.
3. Punctate nonobstructing left renal stone.

## 2023-05-14 IMAGING — CT CT ABD-PELV W/ CM
2 of 5 series · 16 of 46 positions shown, 18 images · IV contrast (APPLIED)
Comparison: 07/11/2021 prior CTs

CLINICAL DATA: 41-year-old female with acute RIGHT abdominal and
pelvic pain. History of recent cystocele repair.

EXAM:
CT ABDOMEN AND PELVIS WITH CONTRAST
TECHNIQUE: Multidetector CT imaging of the abdomen and pelvis was performed
using the standard protocol following bolus administration of
intravenous contrast.

[Series 3: abdomen 5.0 (person_name) · axial · 0.86mm/px · z∈[-967,-557]mm · 13 of 96 slices shown, 15 images]
[im 7/96  soft-tissue]
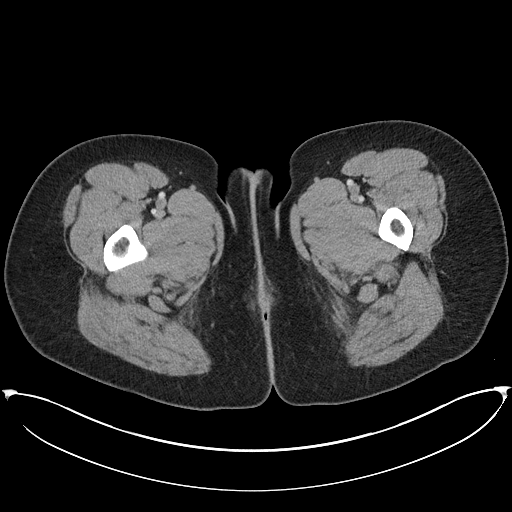
[im 7/96  bone]
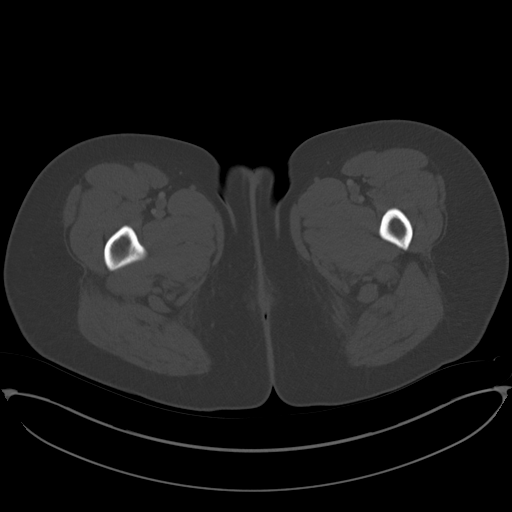
[im 13/96  soft-tissue]
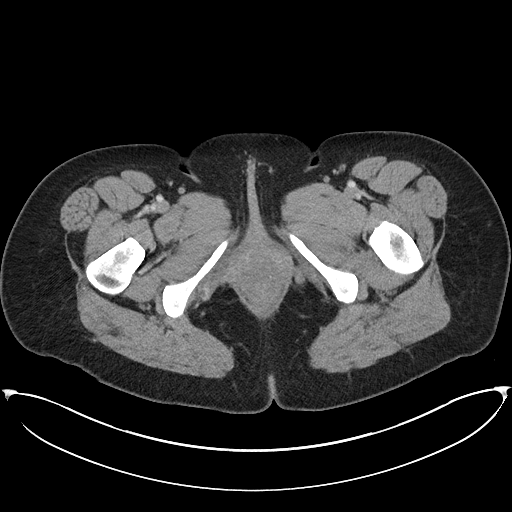
[im 20/96  soft-tissue]
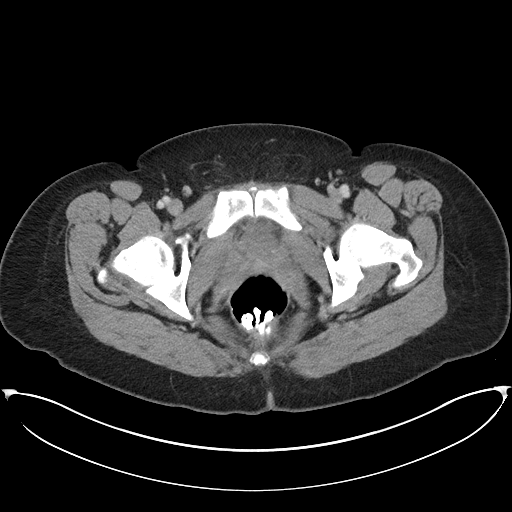
[im 26/96  soft-tissue]
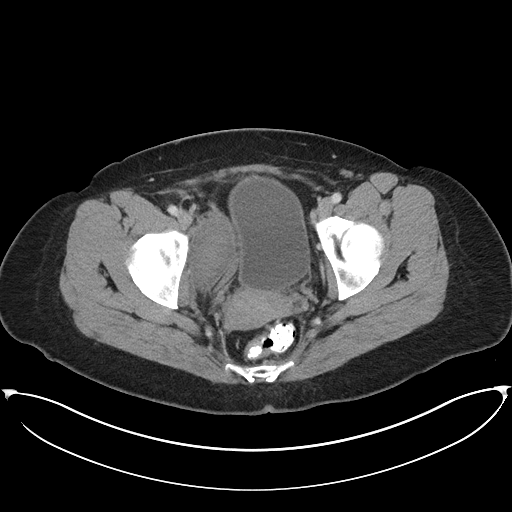
[im 32/96  soft-tissue]
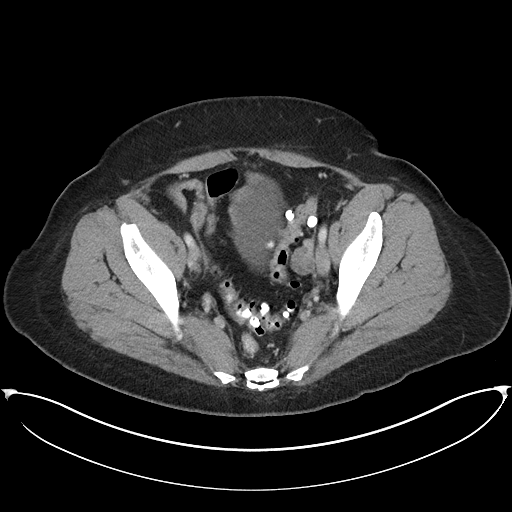
[im 39/96  soft-tissue]
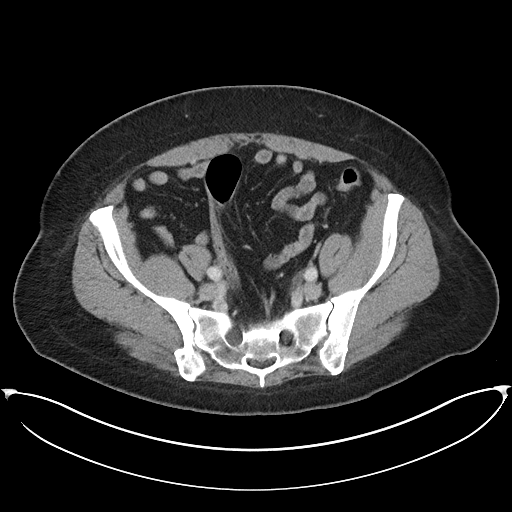
[im 51/96  soft-tissue]
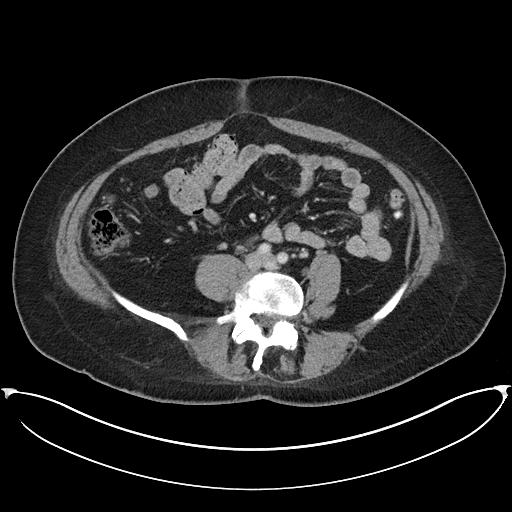
[im 58/96  soft-tissue]
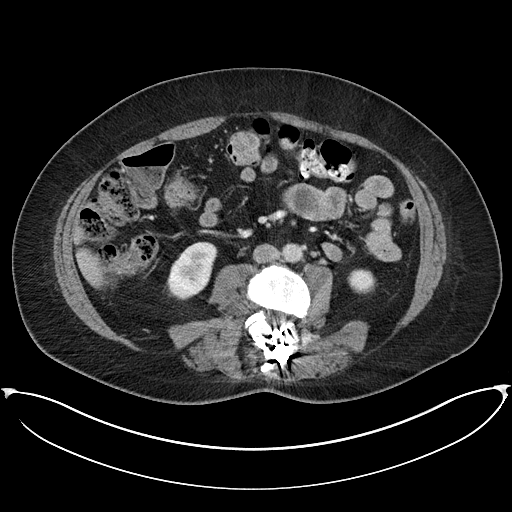
[im 64/96  soft-tissue]
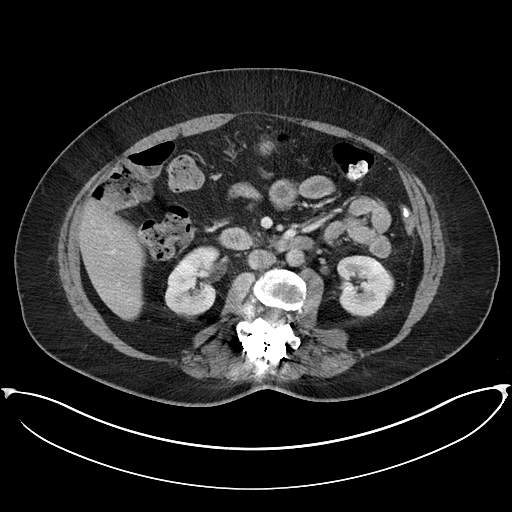
[im 64/96  bone]
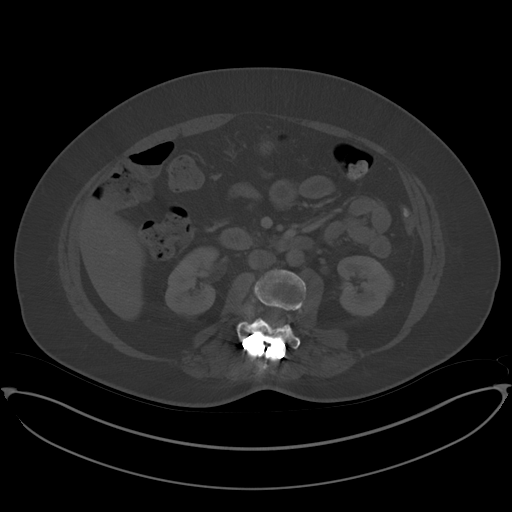
[im 70/96  soft-tissue]
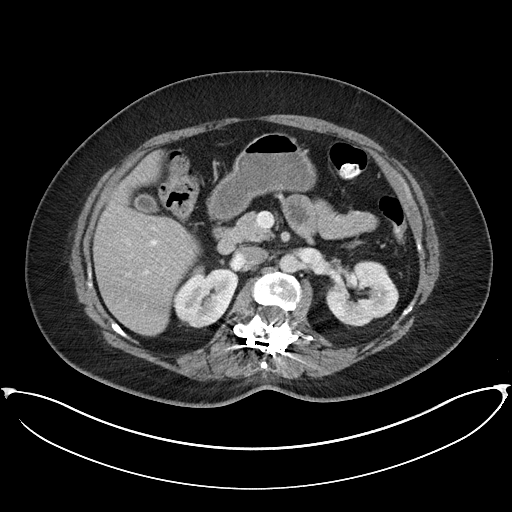
[im 77/96  soft-tissue]
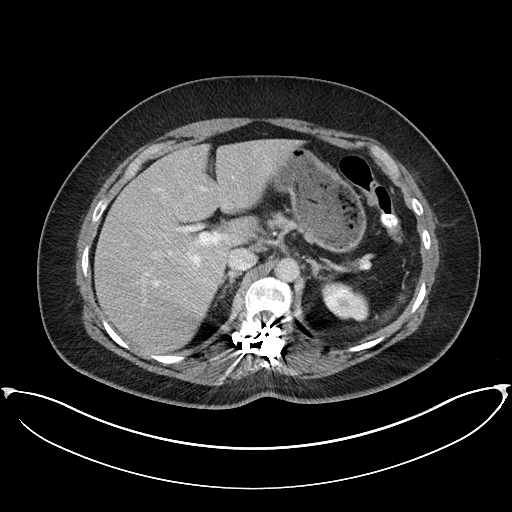
[im 83/96  soft-tissue]
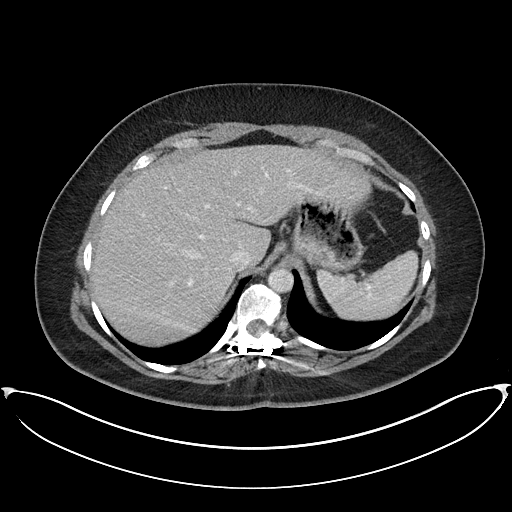
[im 89/96  soft-tissue]
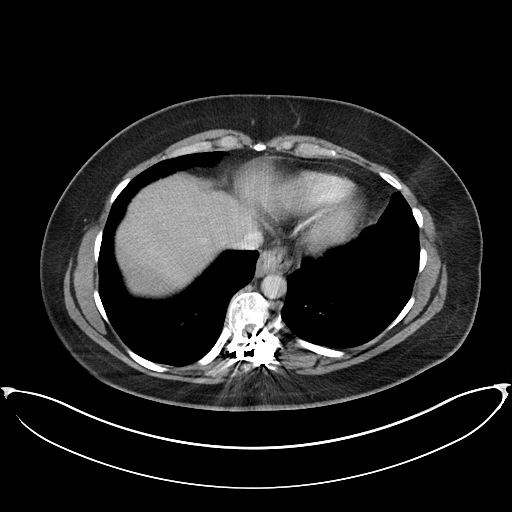

[Series 6: abdomen 3.0 (person_name) · coronal · 0.88mm/px · 3 of 101 slices shown]
[im 34/101  soft-tissue]
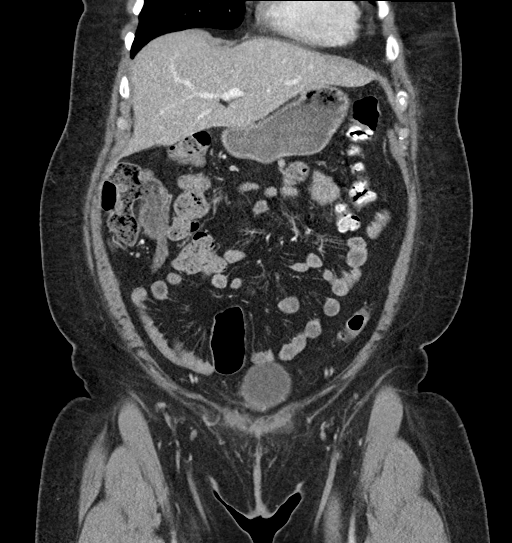
[im 45/101  soft-tissue]
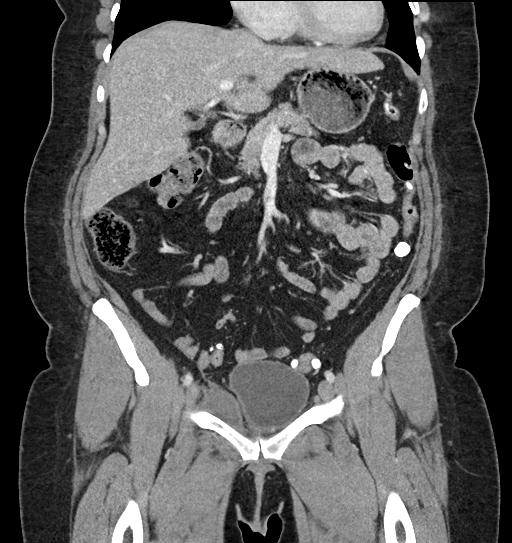
[im 56/101  soft-tissue]
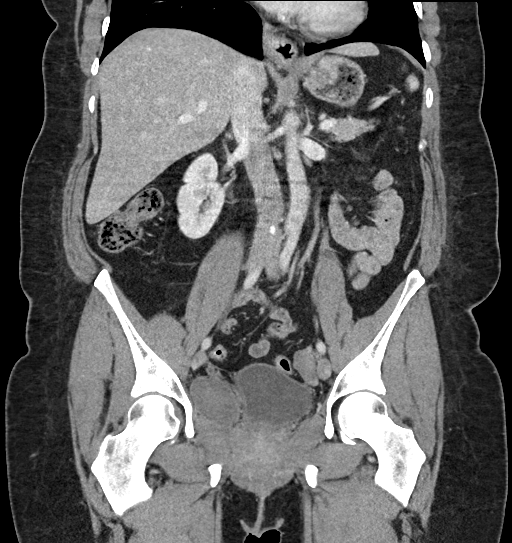

[16 of 46 positions shown; findings below may reference images not displayed]

RADIATION DOSE REDUCTION: This exam was performed according to the
departmental dose-optimization program which includes automated
exposure control, adjustment of the mA and/or kV according to
patient size and/or use of iterative reconstruction technique.

CONTRAST:  100mL OMNIPAQUE IOHEXOL 300 MG/ML  SOLN
FINDINGS: Lower chest: No acute abnormality

Hepatobiliary: The liver and gallbladder are unremarkable. There is
no evidence of intrahepatic or extrahepatic biliary dilatation.

Pancreas: Unremarkable

Spleen: Unremarkable

Adrenals/Urinary Tract: The kidneys and adrenal glands are
unremarkable except for RIGHT renal cysts. No bladder abnormalities
are identified. An unchanged 3.5 x 7 cm RIGHT pelvic sidewall
collection (series 3: Image 72) slightly displaces the bladder to
the LEFT but no other bladder abnormalities are noted.

Stomach/Bowel: A small hiatal hernia is noted. There is no evidence
of bowel obstruction, bowel wall thickening or inflammatory changes.
Colonic diverticulosis noted without evidence of acute
diverticulitis.

Vascular/Lymphatic: No significant vascular findings are present. No
enlarged abdominal or pelvic lymph nodes.

Reproductive: Status post hysterectomy. No adnexal masses.

Other: No ascites or pneumoperitoneum identified. A small umbilical
hernia containing fat is again noted.

Musculoskeletal: No acute or suspicious bony abnormalities are
noted. Posterior fusion changes in the thoracolumbar spine again
noted. Degenerative changes in the lumbar spine again identified.
IMPRESSION: 1. No evidence of new or acute abnormality.
2. Unchanged 3.5 x 7 cm RIGHT pelvic sidewall collection, question
seroma/hematoma/postoperative collection.
3. Small hiatal hernia.

## 2023-05-15 ENCOUNTER — Ambulatory Visit
Admission: RE | Admit: 2023-05-15 | Discharge: 2023-05-15 | Disposition: A | Discharge status: Home / Self Care | Payer: 59 | Source: Ambulatory Visit | Attending: Orthopedic Surgery | Admitting: Orthopedic Surgery

## 2023-05-15 DIAGNOSIS — M546 Pain in thoracic spine: Secondary | ICD-10-CM

## 2023-05-15 DIAGNOSIS — M545 Low back pain, unspecified: Secondary | ICD-10-CM

## 2023-05-27 ENCOUNTER — Other Ambulatory Visit: Payer: Self-pay | Admitting: Orthopedic Surgery

## 2023-05-27 DIAGNOSIS — M545 Low back pain, unspecified: Secondary | ICD-10-CM

## 2023-06-04 ENCOUNTER — Ambulatory Visit
Admission: RE | Admit: 2023-06-04 | Discharge: 2023-06-04 | Disposition: A | Discharge status: Home / Self Care | Payer: 59 | Source: Ambulatory Visit | Attending: Orthopedic Surgery | Admitting: Orthopedic Surgery

## 2023-06-04 DIAGNOSIS — M545 Low back pain, unspecified: Secondary | ICD-10-CM

## 2023-06-04 MED ORDER — IOPAMIDOL (ISOVUE-M 200) INJECTION 41%
1.0000 mL | Freq: Once | INTRAMUSCULAR | Status: AC | PRN
  Administered 2023-06-04: 1 mL via INTRA_ARTICULAR

## 2023-06-19 IMAGING — CR DG CHEST 2V
2 series · 2 of 2 positions shown · non-contrast
Comparison: Chest x-ray dated February 20, 2021

CLINICAL DATA: Chest pain

EXAM:
CHEST - 2 VIEW

[w chest pa]
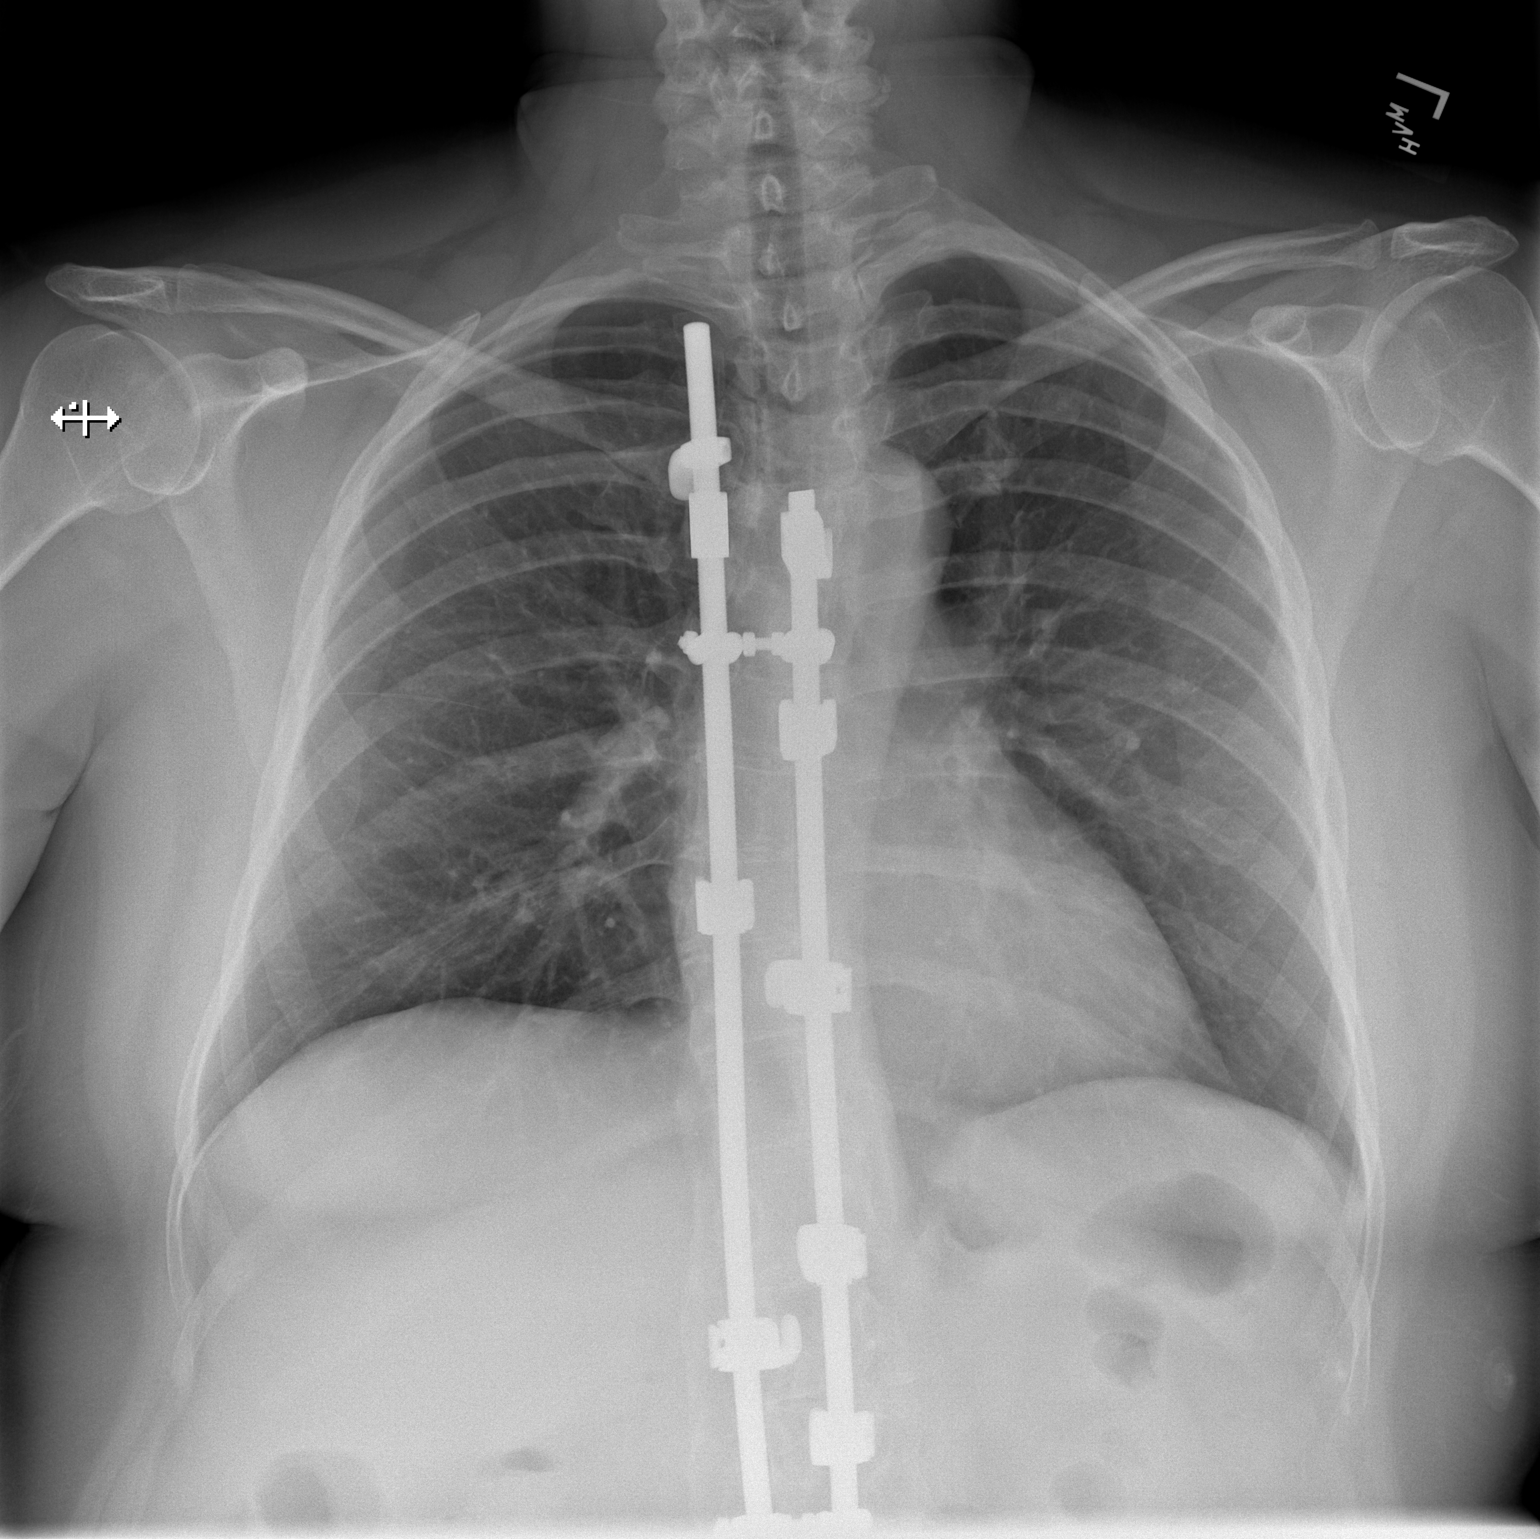

[w chest lat]
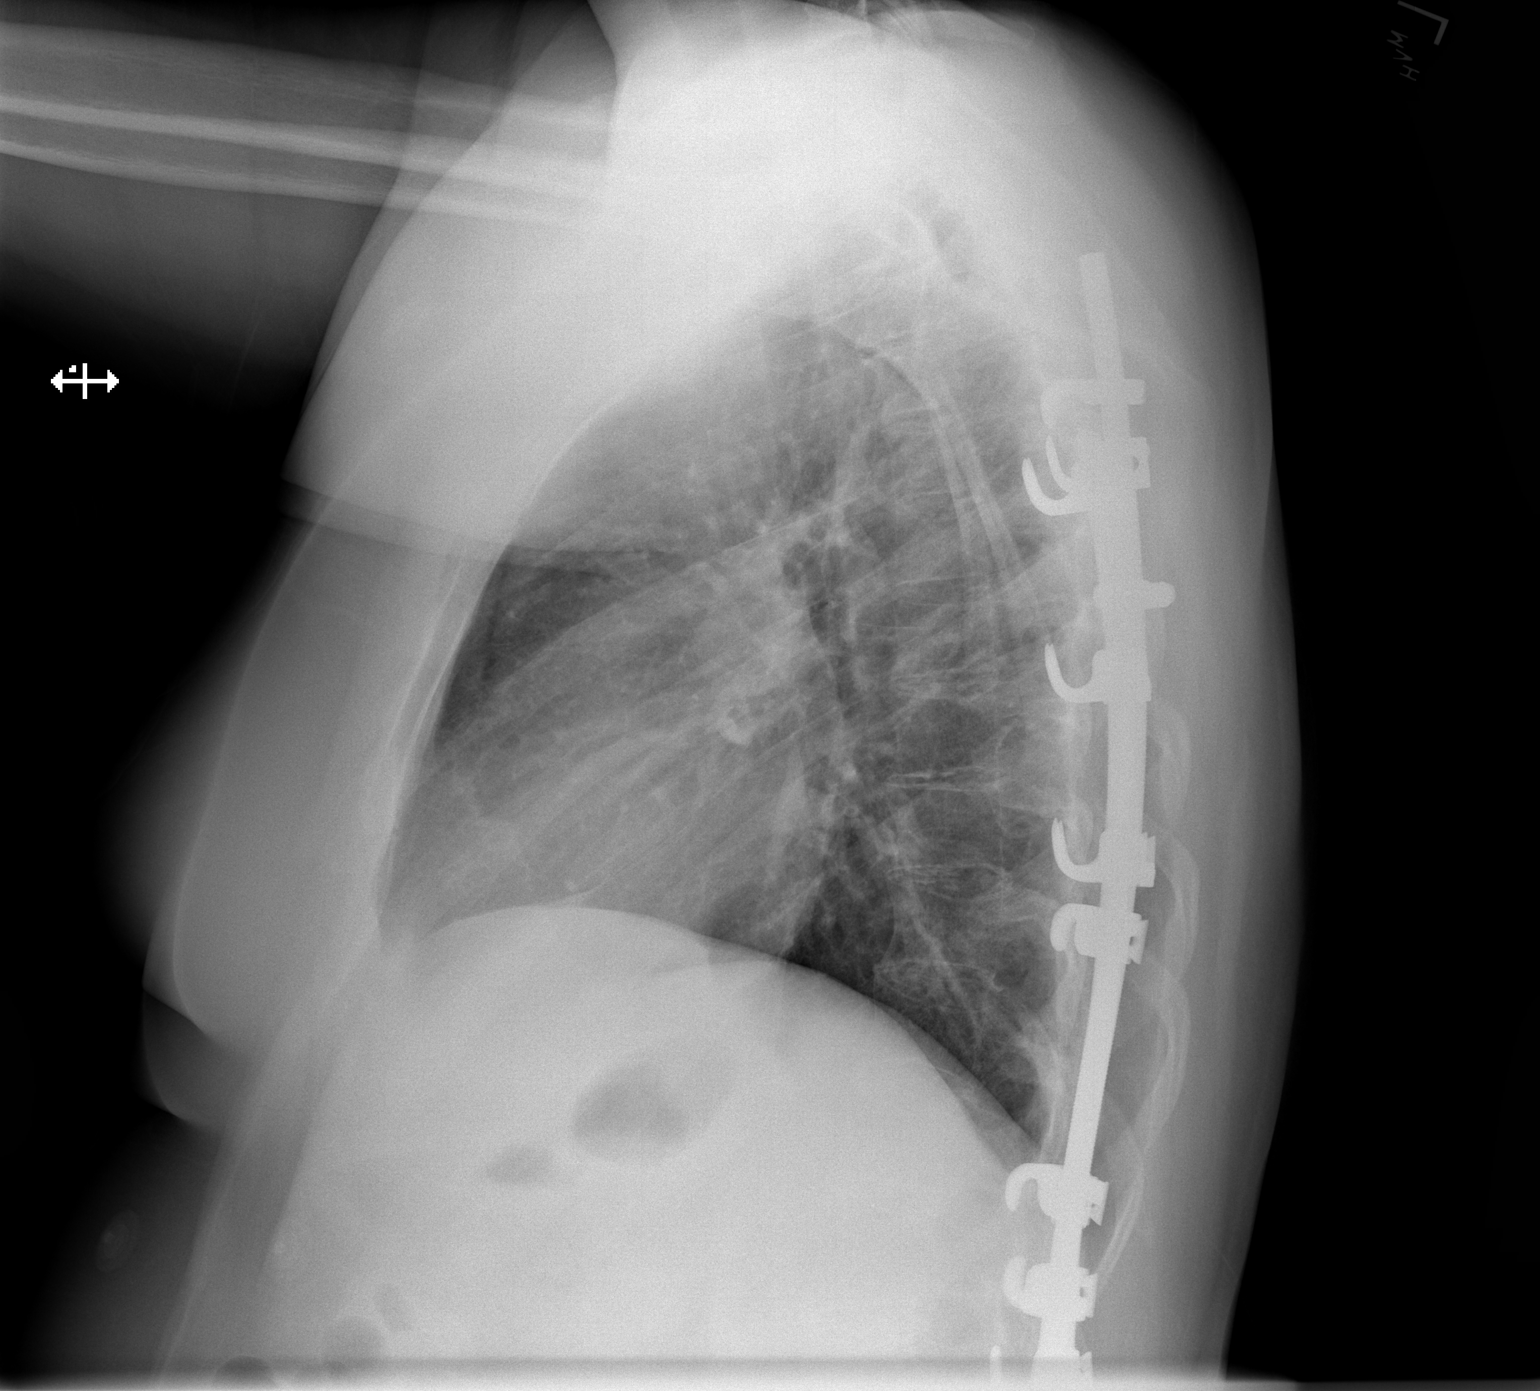

[2 of 2 positions shown; findings below may reference images not displayed]

FINDINGS: The heart size and mediastinal contours are within normal limits.
Both lungs are clear. The visualized skeletal structures are
unremarkable. Thoracolumbar spine hardware noted.
IMPRESSION: No active cardiopulmonary disease.

## 2023-09-02 ENCOUNTER — Ambulatory Visit: Admitting: Dermatology

## 2023-09-09 ENCOUNTER — Encounter: Payer: Self-pay | Admitting: Dermatology

## 2023-09-09 ENCOUNTER — Ambulatory Visit: Admitting: Dermatology

## 2023-09-09 DIAGNOSIS — L720 Epidermal cyst: Secondary | ICD-10-CM

## 2023-09-09 NOTE — Progress Notes (Signed)
   Follow-Up Visit   Subjective  Crystal Pearson is a 43 y.o. female who presents for the following: spot at back. Present for about 2 months, patient thinks it was a pimple and now looks like a dry patch of skin and itchy. Patient's mom tried to squeeze it previously and there was some drainage.  The patient has spots, moles and lesions to be evaluated, some may be new or changing and the patient may have concern these could be cancer.   The following portions of the chart were reviewed this encounter and updated as appropriate: medications, allergies, medical history  Review of Systems:  No other skin or systemic complaints except as noted in HPI or Assessment and Plan.  Objective  Well appearing patient in no apparent distress; mood and affect are within normal limits.   A focused examination was performed of the following areas: back  Relevant exam findings are noted in the Assessment and Plan.  left mid back 8 mm pink papule with dilated follicular ostium    Assessment & Plan       EPIDERMAL INCLUSION CYST left mid back Skin / nail biopsy - left mid back Type of biopsy: punch   Informed consent: discussed and consent obtained   Timeout: patient name, date of birth, surgical site, and procedure verified   Procedure prep:  Patient was prepped and draped in usual sterile fashion Prep type:  Isopropyl alcohol Anesthesia: the lesion was anesthetized in a standard fashion   Anesthetic:  1% lidocaine  w/ epinephrine  1-100,000 buffered w/ 8.4% NaHCO3 Punch size:  4 mm Suture size:  4-0 Suture type: Prolene (polypropylene)   Suture removal (days):  14 Hemostasis achieved with: suture, pressure and aluminum chloride   Outcome: patient tolerated procedure well   Post-procedure details: sterile dressing applied and wound care instructions given   Dressing type: bandage and petrolatum   Additional details:  Excision performed with forceps and gradle scissors Specimen 1 -  Surgical pathology Differential Diagnosis: Cyst vs Other  Check Margins: No 8 mm pink papule with dilated follicular ostium  Return in about 2 weeks (around 09/23/2023) for Suture Removal, with Dr. Felipe Horton.  Kerstin Peeling, RMA, am acting as scribe for Harris Liming, MD .   Documentation: I have reviewed the above documentation for accuracy and completeness, and I agree with the above.  Harris Liming, MD

## 2023-09-09 NOTE — Patient Instructions (Addendum)

## 2023-09-14 LAB — SURGICAL PATHOLOGY

## 2023-09-15 ENCOUNTER — Ambulatory Visit: Admitting: Dermatology

## 2023-09-15 ENCOUNTER — Ambulatory Visit: Payer: Self-pay | Admitting: Dermatology

## 2023-09-23 ENCOUNTER — Encounter: Payer: Self-pay | Admitting: Dermatology

## 2023-09-23 ENCOUNTER — Ambulatory Visit: Admitting: Dermatology

## 2023-09-23 DIAGNOSIS — Z5189 Encounter for other specified aftercare: Secondary | ICD-10-CM

## 2023-09-23 DIAGNOSIS — Z4802 Encounter for removal of sutures: Secondary | ICD-10-CM

## 2023-09-23 DIAGNOSIS — L720 Epidermal cyst: Secondary | ICD-10-CM

## 2023-09-23 NOTE — Patient Instructions (Signed)

## 2023-09-23 NOTE — Progress Notes (Signed)
   Follow-Up Visit   Subjective  Crystal Pearson is a 43 y.o. female who presents for the following: Suture removal  Pathology showed EPIDERMAL INCLUSION CYST   The following portions of the chart were reviewed this encounter and updated as appropriate: medications, allergies, medical history  Review of Systems:  No other skin or systemic complaints except as noted in HPI or Assessment and Plan.  Objective  Well appearing patient in no apparent distress; mood and affect are within normal limits.  Areas Examined: back Relevant physical exam findings are noted in the Assessment and Plan.    Assessment & Plan   VISIT FOR WOUND CHECK   ENCOUNTER FOR REMOVAL OF SUTURES   Encounter for Removal of Sutures - Incision site is clean, dry and intact. - Wound cleansed, sutures removed, wound cleansed and steri strips applied.  - Discussed pathology results showing EPIDERMAL INCLUSION CYST  at left mid back - Patient advised to keep steri-strips dry until they fall off. - Scars remodel for a full year. - Once steri-strips fall off, patient can apply over-the-counter silicone scar cream once to twice a day to help with scar remodeling if desired. - Patient advised to call with any concerns or if they notice any new or changing lesions.  OPEN COMEDONES Exam: back x 4  Treatment Plan: Cleansed with alcohol. Extractions performed today with comedone extractor, no charge  Return if symptoms worsen or fail to improve.  Kerstin Peeling, RMA, am acting as scribe for Harris Liming, MD .   Documentation: I have reviewed the above documentation for accuracy and completeness, and I agree with the above.  Harris Liming, MD

## 2024-01-18 ENCOUNTER — Other Ambulatory Visit: Payer: Self-pay | Admitting: Family Medicine

## 2024-01-18 DIAGNOSIS — Z1231 Encounter for screening mammogram for malignant neoplasm of breast: Secondary | ICD-10-CM

## 2024-02-02 ENCOUNTER — Encounter: Payer: Self-pay | Admitting: Physical Medicine & Rehabilitation

## 2024-02-07 ENCOUNTER — Encounter: Payer: Self-pay | Admitting: Physical Medicine & Rehabilitation

## 2024-02-15 ENCOUNTER — Ambulatory Visit (INDEPENDENT_AMBULATORY_CARE_PROVIDER_SITE_OTHER): Admitting: Dermatology

## 2024-02-15 DIAGNOSIS — D1801 Hemangioma of skin and subcutaneous tissue: Secondary | ICD-10-CM | POA: Diagnosis not present

## 2024-02-15 DIAGNOSIS — L814 Other melanin hyperpigmentation: Secondary | ICD-10-CM

## 2024-02-15 DIAGNOSIS — D485 Neoplasm of uncertain behavior of skin: Secondary | ICD-10-CM

## 2024-02-15 NOTE — Progress Notes (Signed)
   Follow-Up Visit   Subjective  Crystal Pearson is a 43 y.o. female who presents for the following: Spot between the breasts, noticed 6-8 months ago. Never goes away, itchy at times, tried to squeeze and only blood came out.  The following portions of the chart were reviewed this encounter and updated as appropriate: medications, allergies, medical history  Review of Systems:  No other skin or systemic complaints except as noted in HPI or Assessment and Plan.  Objective  Well appearing patient in no apparent distress; mood and affect are within normal limits.  A focused examination was performed of the following areas: Chest  Relevant physical exam findings are noted in the Assessment and Plan.  intermammary 5 mm pink/tan scaly papule    Assessment & Plan   LENTIGINES Exam: scattered tan macules Due to sun exposure Treatment Plan: Benign-appearing, observe. Recommend daily broad spectrum sunscreen SPF 30+ to sun-exposed areas, reapply every 2 hours as needed.  Call for any changes   NEOPLASM OF UNCERTAIN BEHAVIOR OF SKIN intermammary Epidermal / dermal shaving  Lesion diameter (cm):  0.6 Informed consent: discussed and consent obtained   Patient was prepped and draped in usual sterile fashion: Area prepped with alcohol. Anesthesia: the lesion was anesthetized in a standard fashion   Anesthetic:  1% lidocaine  w/ epinephrine  1-100,000 buffered w/ 8.4% NaHCO3 Instrument used: flexible razor blade   Hemostasis achieved with: pressure, aluminum chloride and electrodesiccation   Outcome: patient tolerated procedure well   Post-procedure details: wound care instructions given   Post-procedure details comment:  Ointment and small bandage applied  Specimen 1 - Surgical pathology Differential Diagnosis: Inflamed SK vs Cyst vs Irritated Hemangioma vs other Check Margins: No   Return if symptoms worsen or fail to improve.  IAndrea Kerns, CMA, am acting as scribe for Rexene Rattler, MD .   Documentation: I have reviewed the above documentation for accuracy and completeness, and I agree with the above.  Rexene Rattler, MD

## 2024-02-15 NOTE — Patient Instructions (Addendum)

## 2024-02-16 ENCOUNTER — Ambulatory Visit
Admission: RE | Admit: 2024-02-16 | Discharge: 2024-02-16 | Disposition: A | Discharge status: Home / Self Care | Source: Ambulatory Visit | Attending: Family Medicine | Admitting: Family Medicine

## 2024-02-16 DIAGNOSIS — Z1231 Encounter for screening mammogram for malignant neoplasm of breast: Secondary | ICD-10-CM | POA: Insufficient documentation

## 2024-02-21 ENCOUNTER — Ambulatory Visit: Payer: Self-pay | Admitting: Dermatology

## 2024-02-21 LAB — SURGICAL PATHOLOGY

## 2024-02-22 NOTE — Telephone Encounter (Signed)
 Patient advised of BX results. aw

## 2024-02-22 NOTE — Telephone Encounter (Signed)
-----   Message from Rexene Rattler sent at 02/21/2024  5:23 PM EDT ----- 1. Skin, intermammary :       HEMANGIOMA, BASE INVOLVED   benign - please call patient ----- Message ----- From: Interface, Lab In Three Zero One Sent: 02/21/2024   5:18 PM EDT To: Rexene Rattler, MD

## 2024-02-22 NOTE — Telephone Encounter (Signed)
Left pt message to call for bx results/sh °

## 2024-03-09 ENCOUNTER — Encounter: Admitting: Physical Medicine & Rehabilitation

## 2024-05-11 ENCOUNTER — Encounter: Admitting: Physical Medicine & Rehabilitation
